# Patient Record
Sex: Female | Born: 1970 | Race: White | Hispanic: Yes | Marital: Married | Smoking: Former smoker
Health system: Southern US, Community
[De-identification: ages and names within clinical notes are randomized; demographics above are authoritative.]

## PROBLEM LIST (undated history)

## (undated) DIAGNOSIS — M797 Fibromyalgia: Secondary | ICD-10-CM

## (undated) DIAGNOSIS — O021 Missed abortion: Secondary | ICD-10-CM

## (undated) DIAGNOSIS — I10 Essential (primary) hypertension: Secondary | ICD-10-CM

## (undated) DIAGNOSIS — E288 Other ovarian dysfunction: Secondary | ICD-10-CM

## (undated) DIAGNOSIS — E119 Type 2 diabetes mellitus without complications: Secondary | ICD-10-CM

## (undated) DIAGNOSIS — K589 Irritable bowel syndrome without diarrhea: Secondary | ICD-10-CM

## (undated) DIAGNOSIS — E559 Vitamin D deficiency, unspecified: Secondary | ICD-10-CM

## (undated) DIAGNOSIS — E7800 Pure hypercholesterolemia, unspecified: Secondary | ICD-10-CM

## (undated) HISTORY — DX: Fibromyalgia: M79.7

## (undated) HISTORY — DX: Essential (primary) hypertension: I10

## (undated) HISTORY — DX: Irritable bowel syndrome, unspecified: K58.9

## (undated) HISTORY — DX: Pure hypercholesterolemia, unspecified: E78.00

## (undated) HISTORY — DX: Vitamin D deficiency, unspecified: E55.9

## (undated) HISTORY — DX: Other ovarian dysfunction: E28.8

## (undated) HISTORY — DX: Missed abortion: O02.1

---

## 1997-12-10 ENCOUNTER — Ambulatory Visit (HOSPITAL_COMMUNITY): Admission: RE | Admit: 1997-12-10 | Discharge: 1997-12-10 | Payer: Self-pay | Admitting: Gynecology

## 1998-01-26 ENCOUNTER — Encounter: Admission: RE | Admit: 1998-01-26 | Discharge: 1998-04-26 | Payer: Self-pay | Admitting: Gynecology

## 1998-03-04 ENCOUNTER — Inpatient Hospital Stay (HOSPITAL_COMMUNITY): Admission: AD | Admit: 1998-03-04 | Discharge: 1998-03-04 | Payer: Self-pay | Admitting: Gynecology

## 1998-03-05 ENCOUNTER — Inpatient Hospital Stay (HOSPITAL_COMMUNITY): Admission: AD | Admit: 1998-03-05 | Discharge: 1998-03-05 | Payer: Self-pay | Admitting: Gynecology

## 1998-05-20 ENCOUNTER — Encounter: Admission: RE | Admit: 1998-05-20 | Discharge: 1998-08-18 | Payer: Self-pay | Admitting: Gynecology

## 1998-05-28 ENCOUNTER — Inpatient Hospital Stay (HOSPITAL_COMMUNITY): Admission: AD | Admit: 1998-05-28 | Discharge: 1998-05-28 | Payer: Self-pay | Admitting: *Deleted

## 1998-09-06 ENCOUNTER — Inpatient Hospital Stay (HOSPITAL_COMMUNITY): Admission: AD | Admit: 1998-09-06 | Discharge: 1998-09-07 | Payer: Self-pay | Admitting: Gynecology

## 1998-10-21 ENCOUNTER — Other Ambulatory Visit: Admission: RE | Admit: 1998-10-21 | Discharge: 1998-10-21 | Payer: Self-pay | Admitting: Gynecology

## 1999-12-13 ENCOUNTER — Other Ambulatory Visit: Admission: RE | Admit: 1999-12-13 | Discharge: 1999-12-13 | Payer: Self-pay | Admitting: Gynecology

## 2000-07-25 ENCOUNTER — Encounter: Admission: RE | Admit: 2000-07-25 | Discharge: 2000-10-23 | Payer: Self-pay | Admitting: Specialist

## 2000-10-08 ENCOUNTER — Emergency Department (HOSPITAL_COMMUNITY): Admission: EM | Admit: 2000-10-08 | Discharge: 2000-10-08 | Payer: Self-pay | Admitting: Emergency Medicine

## 2000-10-24 ENCOUNTER — Encounter: Admission: RE | Admit: 2000-10-24 | Discharge: 2000-10-24 | Payer: Self-pay

## 2000-12-05 ENCOUNTER — Encounter: Admission: RE | Admit: 2000-12-05 | Discharge: 2000-12-05 | Payer: Self-pay | Admitting: Internal Medicine

## 2001-04-03 ENCOUNTER — Other Ambulatory Visit: Admission: RE | Admit: 2001-04-03 | Discharge: 2001-04-03 | Payer: Self-pay | Admitting: Gynecology

## 2001-08-20 ENCOUNTER — Encounter: Admission: RE | Admit: 2001-08-20 | Discharge: 2001-11-18 | Payer: Self-pay | Admitting: Gynecology

## 2001-10-07 ENCOUNTER — Inpatient Hospital Stay (HOSPITAL_COMMUNITY): Admission: AD | Admit: 2001-10-07 | Discharge: 2001-10-07 | Payer: Self-pay | Admitting: Gynecology

## 2001-11-12 ENCOUNTER — Encounter: Admission: RE | Admit: 2001-11-12 | Discharge: 2002-02-10 | Payer: Self-pay | Admitting: Gynecology

## 2002-01-05 ENCOUNTER — Encounter (HOSPITAL_COMMUNITY): Admission: RE | Admit: 2002-01-05 | Discharge: 2002-02-04 | Payer: Self-pay | Admitting: Gynecology

## 2002-02-03 ENCOUNTER — Encounter: Payer: Self-pay | Admitting: Gynecology

## 2002-02-06 ENCOUNTER — Inpatient Hospital Stay (HOSPITAL_COMMUNITY): Admission: AD | Admit: 2002-02-06 | Discharge: 2002-02-09 | Payer: Self-pay | Admitting: Gynecology

## 2002-02-06 ENCOUNTER — Encounter (INDEPENDENT_AMBULATORY_CARE_PROVIDER_SITE_OTHER): Payer: Self-pay

## 2002-03-26 ENCOUNTER — Other Ambulatory Visit: Admission: RE | Admit: 2002-03-26 | Discharge: 2002-03-26 | Payer: Self-pay | Admitting: Gynecology

## 2002-10-15 HISTORY — PX: CHOLECYSTECTOMY: SHX55

## 2003-01-02 ENCOUNTER — Encounter: Payer: Self-pay | Admitting: Internal Medicine

## 2003-01-02 ENCOUNTER — Ambulatory Visit (HOSPITAL_COMMUNITY): Admission: RE | Admit: 2003-01-02 | Discharge: 2003-01-02 | Payer: Self-pay | Admitting: Internal Medicine

## 2003-01-03 ENCOUNTER — Emergency Department (HOSPITAL_COMMUNITY): Admission: EM | Admit: 2003-01-03 | Discharge: 2003-01-03 | Payer: Self-pay | Admitting: Emergency Medicine

## 2003-01-03 ENCOUNTER — Encounter: Payer: Self-pay | Admitting: Emergency Medicine

## 2003-01-09 ENCOUNTER — Emergency Department (HOSPITAL_COMMUNITY): Admission: EM | Admit: 2003-01-09 | Discharge: 2003-01-09 | Payer: Self-pay | Admitting: Emergency Medicine

## 2003-01-11 ENCOUNTER — Encounter: Payer: Self-pay | Admitting: General Surgery

## 2003-01-11 ENCOUNTER — Encounter (INDEPENDENT_AMBULATORY_CARE_PROVIDER_SITE_OTHER): Payer: Self-pay | Admitting: *Deleted

## 2003-01-11 ENCOUNTER — Inpatient Hospital Stay (HOSPITAL_COMMUNITY): Admission: RE | Admit: 2003-01-11 | Discharge: 2003-01-13 | Payer: Self-pay | Admitting: General Surgery

## 2003-01-12 ENCOUNTER — Encounter: Payer: Self-pay | Admitting: General Surgery

## 2003-01-21 ENCOUNTER — Emergency Department (HOSPITAL_COMMUNITY): Admission: EM | Admit: 2003-01-21 | Discharge: 2003-01-21 | Payer: Self-pay | Admitting: Emergency Medicine

## 2003-01-21 ENCOUNTER — Encounter: Payer: Self-pay | Admitting: Emergency Medicine

## 2003-03-31 ENCOUNTER — Ambulatory Visit (HOSPITAL_COMMUNITY): Admission: RE | Admit: 2003-03-31 | Discharge: 2003-03-31 | Payer: Self-pay | Admitting: Family Medicine

## 2003-03-31 ENCOUNTER — Encounter: Payer: Self-pay | Admitting: Family Medicine

## 2003-03-31 ENCOUNTER — Encounter: Payer: Self-pay | Admitting: Emergency Medicine

## 2003-03-31 ENCOUNTER — Emergency Department (HOSPITAL_COMMUNITY): Admission: EM | Admit: 2003-03-31 | Discharge: 2003-03-31 | Payer: Self-pay | Admitting: Emergency Medicine

## 2003-04-27 ENCOUNTER — Other Ambulatory Visit: Admission: RE | Admit: 2003-04-27 | Discharge: 2003-04-27 | Payer: Self-pay | Admitting: Gynecology

## 2003-06-07 ENCOUNTER — Emergency Department (HOSPITAL_COMMUNITY): Admission: EM | Admit: 2003-06-07 | Discharge: 2003-06-07 | Payer: Self-pay

## 2004-05-25 ENCOUNTER — Emergency Department (HOSPITAL_COMMUNITY): Admission: EM | Admit: 2004-05-25 | Discharge: 2004-05-26 | Payer: Self-pay

## 2004-05-28 ENCOUNTER — Observation Stay (HOSPITAL_COMMUNITY): Admission: AD | Admit: 2004-05-28 | Discharge: 2004-05-29 | Payer: Self-pay | Admitting: Gynecology

## 2004-05-28 ENCOUNTER — Encounter (INDEPENDENT_AMBULATORY_CARE_PROVIDER_SITE_OTHER): Payer: Self-pay | Admitting: Specialist

## 2004-06-15 ENCOUNTER — Other Ambulatory Visit: Admission: RE | Admit: 2004-06-15 | Discharge: 2004-06-15 | Payer: Self-pay | Admitting: Obstetrics and Gynecology

## 2004-07-15 ENCOUNTER — Emergency Department (HOSPITAL_COMMUNITY): Admission: EM | Admit: 2004-07-15 | Discharge: 2004-07-15 | Payer: Self-pay | Admitting: Unknown Physician Specialty

## 2004-07-15 ENCOUNTER — Emergency Department (HOSPITAL_COMMUNITY): Admission: EM | Admit: 2004-07-15 | Discharge: 2004-07-15 | Payer: Self-pay | Admitting: Emergency Medicine

## 2004-08-28 ENCOUNTER — Ambulatory Visit: Payer: Self-pay | Admitting: Nurse Practitioner

## 2004-08-29 ENCOUNTER — Ambulatory Visit: Payer: Self-pay | Admitting: Nurse Practitioner

## 2004-08-29 ENCOUNTER — Ambulatory Visit: Payer: Self-pay | Admitting: *Deleted

## 2004-08-31 ENCOUNTER — Ambulatory Visit (HOSPITAL_COMMUNITY): Admission: RE | Admit: 2004-08-31 | Discharge: 2004-08-31 | Payer: Self-pay | Admitting: Internal Medicine

## 2004-09-21 ENCOUNTER — Ambulatory Visit: Payer: Self-pay | Admitting: Nurse Practitioner

## 2004-11-08 ENCOUNTER — Ambulatory Visit: Payer: Self-pay | Admitting: Nurse Practitioner

## 2004-11-15 ENCOUNTER — Emergency Department (HOSPITAL_COMMUNITY): Admission: EM | Admit: 2004-11-15 | Discharge: 2004-11-16 | Payer: Self-pay | Admitting: Emergency Medicine

## 2004-12-25 ENCOUNTER — Ambulatory Visit: Payer: Self-pay | Admitting: Nurse Practitioner

## 2004-12-27 ENCOUNTER — Ambulatory Visit (HOSPITAL_COMMUNITY): Admission: RE | Admit: 2004-12-27 | Discharge: 2004-12-27 | Payer: Self-pay | Admitting: Gastroenterology

## 2004-12-27 ENCOUNTER — Encounter (INDEPENDENT_AMBULATORY_CARE_PROVIDER_SITE_OTHER): Payer: Self-pay | Admitting: *Deleted

## 2005-01-11 ENCOUNTER — Ambulatory Visit: Payer: Self-pay | Admitting: Nurse Practitioner

## 2005-02-19 ENCOUNTER — Ambulatory Visit: Payer: Self-pay | Admitting: Nurse Practitioner

## 2005-03-13 ENCOUNTER — Ambulatory Visit: Payer: Self-pay | Admitting: Nurse Practitioner

## 2005-04-12 ENCOUNTER — Ambulatory Visit: Payer: Self-pay | Admitting: Nurse Practitioner

## 2005-07-03 ENCOUNTER — Ambulatory Visit: Payer: Self-pay | Admitting: Nurse Practitioner

## 2005-07-03 ENCOUNTER — Emergency Department (HOSPITAL_COMMUNITY): Admission: EM | Admit: 2005-07-03 | Discharge: 2005-07-03 | Payer: Self-pay | Admitting: Emergency Medicine

## 2005-09-04 ENCOUNTER — Other Ambulatory Visit: Admission: RE | Admit: 2005-09-04 | Discharge: 2005-09-04 | Payer: Self-pay | Admitting: Gynecology

## 2005-09-25 ENCOUNTER — Ambulatory Visit: Payer: Self-pay | Admitting: Nurse Practitioner

## 2005-10-10 ENCOUNTER — Ambulatory Visit: Payer: Self-pay | Admitting: Nurse Practitioner

## 2005-11-05 ENCOUNTER — Ambulatory Visit: Payer: Self-pay | Admitting: Nurse Practitioner

## 2005-11-13 ENCOUNTER — Ambulatory Visit (HOSPITAL_COMMUNITY): Admission: RE | Admit: 2005-11-13 | Discharge: 2005-11-13 | Payer: Self-pay | Admitting: Internal Medicine

## 2005-11-13 ENCOUNTER — Ambulatory Visit: Payer: Self-pay | Admitting: Nurse Practitioner

## 2005-11-29 ENCOUNTER — Emergency Department (HOSPITAL_COMMUNITY): Admission: EM | Admit: 2005-11-29 | Discharge: 2005-11-29 | Payer: Self-pay | Admitting: Emergency Medicine

## 2006-04-09 ENCOUNTER — Ambulatory Visit: Payer: Self-pay | Admitting: Internal Medicine

## 2006-04-16 ENCOUNTER — Ambulatory Visit: Payer: Self-pay | Admitting: Internal Medicine

## 2006-04-18 ENCOUNTER — Ambulatory Visit: Payer: Self-pay | Admitting: Nurse Practitioner

## 2006-06-12 ENCOUNTER — Ambulatory Visit: Payer: Self-pay | Admitting: Nurse Practitioner

## 2006-07-05 ENCOUNTER — Ambulatory Visit: Payer: Self-pay | Admitting: Family Medicine

## 2006-08-06 ENCOUNTER — Ambulatory Visit: Payer: Self-pay | Admitting: Nurse Practitioner

## 2006-08-07 ENCOUNTER — Ambulatory Visit (HOSPITAL_COMMUNITY): Admission: RE | Admit: 2006-08-07 | Discharge: 2006-08-07 | Payer: Self-pay | Admitting: Internal Medicine

## 2006-08-09 ENCOUNTER — Inpatient Hospital Stay (HOSPITAL_COMMUNITY): Admission: AD | Admit: 2006-08-09 | Discharge: 2006-08-09 | Payer: Self-pay | Admitting: Gynecology

## 2006-09-26 ENCOUNTER — Ambulatory Visit: Payer: Self-pay | Admitting: Nurse Practitioner

## 2006-12-19 ENCOUNTER — Ambulatory Visit: Payer: Self-pay | Admitting: Nurse Practitioner

## 2007-02-01 ENCOUNTER — Emergency Department (HOSPITAL_COMMUNITY): Admission: EM | Admit: 2007-02-01 | Discharge: 2007-02-01 | Payer: Self-pay | Admitting: Emergency Medicine

## 2007-02-05 ENCOUNTER — Ambulatory Visit: Payer: Self-pay | Admitting: Nurse Practitioner

## 2007-02-12 ENCOUNTER — Ambulatory Visit: Payer: Self-pay | Admitting: Nurse Practitioner

## 2007-04-01 ENCOUNTER — Ambulatory Visit: Payer: Self-pay | Admitting: Internal Medicine

## 2007-04-08 ENCOUNTER — Ambulatory Visit: Payer: Self-pay | Admitting: Internal Medicine

## 2007-04-12 ENCOUNTER — Emergency Department (HOSPITAL_COMMUNITY): Admission: EM | Admit: 2007-04-12 | Discharge: 2007-04-12 | Payer: Self-pay | Admitting: Emergency Medicine

## 2007-04-14 DIAGNOSIS — J309 Allergic rhinitis, unspecified: Secondary | ICD-10-CM | POA: Insufficient documentation

## 2007-04-14 DIAGNOSIS — K219 Gastro-esophageal reflux disease without esophagitis: Secondary | ICD-10-CM

## 2007-04-14 DIAGNOSIS — I10 Essential (primary) hypertension: Secondary | ICD-10-CM | POA: Insufficient documentation

## 2007-04-14 DIAGNOSIS — E1169 Type 2 diabetes mellitus with other specified complication: Secondary | ICD-10-CM | POA: Insufficient documentation

## 2007-04-14 DIAGNOSIS — E119 Type 2 diabetes mellitus without complications: Secondary | ICD-10-CM

## 2007-04-14 DIAGNOSIS — M8580 Other specified disorders of bone density and structure, unspecified site: Secondary | ICD-10-CM

## 2007-04-14 DIAGNOSIS — F411 Generalized anxiety disorder: Secondary | ICD-10-CM

## 2007-04-14 DIAGNOSIS — R519 Headache, unspecified: Secondary | ICD-10-CM | POA: Insufficient documentation

## 2007-04-14 DIAGNOSIS — R51 Headache: Secondary | ICD-10-CM

## 2007-04-14 DIAGNOSIS — Z87442 Personal history of urinary calculi: Secondary | ICD-10-CM

## 2007-04-16 ENCOUNTER — Ambulatory Visit: Payer: Self-pay | Admitting: Internal Medicine

## 2007-07-02 ENCOUNTER — Encounter (INDEPENDENT_AMBULATORY_CARE_PROVIDER_SITE_OTHER): Payer: Self-pay | Admitting: *Deleted

## 2007-09-15 ENCOUNTER — Ambulatory Visit: Payer: Self-pay | Admitting: Family Medicine

## 2007-11-28 ENCOUNTER — Ambulatory Visit: Payer: Self-pay | Admitting: Family Medicine

## 2008-01-20 ENCOUNTER — Ambulatory Visit: Payer: Self-pay | Admitting: Internal Medicine

## 2008-02-26 ENCOUNTER — Ambulatory Visit: Payer: Self-pay | Admitting: Internal Medicine

## 2008-02-26 LAB — CONVERTED CEMR LAB
AST: 14 units/L (ref 0–37)
Alkaline Phosphatase: 68 units/L (ref 39–117)
BUN: 10 mg/dL (ref 6–23)
Basophils Relative: 1 % (ref 0–1)
Calcium: 8.9 mg/dL (ref 8.4–10.5)
Creatinine, Ser: 0.59 mg/dL (ref 0.40–1.20)
Eosinophils Relative: 2 % (ref 0–5)
Glucose, Bld: 106 mg/dL — ABNORMAL HIGH (ref 70–99)
HCT: 39.3 % (ref 36.0–46.0)
MCHC: 36.4 g/dL — ABNORMAL HIGH (ref 30.0–36.0)
MCV: 91.8 fL (ref 78.0–100.0)
Monocytes Absolute: 0.8 10*3/uL (ref 0.1–1.0)
Monocytes Relative: 8 % (ref 3–12)
Neutro Abs: 5.8 10*3/uL (ref 1.7–7.7)
Neutrophils Relative %: 59 % (ref 43–77)
Platelets: 351 10*3/uL (ref 150–400)
Potassium: 4.3 meq/L (ref 3.5–5.3)
RDW: 13.9 % (ref 11.5–15.5)

## 2008-03-05 ENCOUNTER — Ambulatory Visit (HOSPITAL_COMMUNITY): Admission: RE | Admit: 2008-03-05 | Discharge: 2008-03-05 | Payer: Self-pay | Admitting: Family Medicine

## 2008-04-01 ENCOUNTER — Ambulatory Visit: Payer: Self-pay | Admitting: Internal Medicine

## 2008-04-01 ENCOUNTER — Ambulatory Visit (HOSPITAL_COMMUNITY): Admission: RE | Admit: 2008-04-01 | Discharge: 2008-04-01 | Payer: Self-pay | Admitting: Internal Medicine

## 2008-04-02 ENCOUNTER — Encounter: Payer: Self-pay | Admitting: Internal Medicine

## 2008-04-14 ENCOUNTER — Ambulatory Visit: Payer: Self-pay | Admitting: Internal Medicine

## 2008-04-14 DIAGNOSIS — K589 Irritable bowel syndrome without diarrhea: Secondary | ICD-10-CM | POA: Insufficient documentation

## 2008-06-22 ENCOUNTER — Ambulatory Visit: Payer: Self-pay | Admitting: Internal Medicine

## 2008-06-29 ENCOUNTER — Ambulatory Visit: Payer: Self-pay | Admitting: Internal Medicine

## 2008-07-14 ENCOUNTER — Ambulatory Visit: Payer: Self-pay | Admitting: Internal Medicine

## 2008-07-14 LAB — CONVERTED CEMR LAB
Alkaline Phosphatase: 60 units/L (ref 39–117)
CO2: 19 meq/L (ref 19–32)
Calcium: 8.7 mg/dL (ref 8.4–10.5)
Chloride: 108 meq/L (ref 96–112)
Cholesterol: 160 mg/dL (ref 0–200)
LDL Cholesterol: 84 mg/dL (ref 0–99)
Potassium: 4 meq/L (ref 3.5–5.3)
Total Bilirubin: 0.4 mg/dL (ref 0.3–1.2)
Total CHOL/HDL Ratio: 3.5
Triglycerides: 152 mg/dL — ABNORMAL HIGH (ref <150)
VLDL: 30 mg/dL (ref 0–40)

## 2008-07-19 ENCOUNTER — Ambulatory Visit: Payer: Self-pay | Admitting: Internal Medicine

## 2008-08-05 ENCOUNTER — Emergency Department (HOSPITAL_COMMUNITY): Admission: EM | Admit: 2008-08-05 | Discharge: 2008-08-05 | Payer: Self-pay | Admitting: Emergency Medicine

## 2008-09-04 ENCOUNTER — Emergency Department (HOSPITAL_COMMUNITY): Admission: EM | Admit: 2008-09-04 | Discharge: 2008-09-04 | Payer: Self-pay | Admitting: Emergency Medicine

## 2008-09-27 ENCOUNTER — Emergency Department (HOSPITAL_COMMUNITY): Admission: EM | Admit: 2008-09-27 | Discharge: 2008-09-27 | Payer: Self-pay | Admitting: Emergency Medicine

## 2008-09-27 ENCOUNTER — Encounter (INDEPENDENT_AMBULATORY_CARE_PROVIDER_SITE_OTHER): Payer: Self-pay | Admitting: *Deleted

## 2008-09-28 ENCOUNTER — Emergency Department (HOSPITAL_COMMUNITY): Admission: EM | Admit: 2008-09-28 | Discharge: 2008-09-28 | Payer: Self-pay | Admitting: Emergency Medicine

## 2008-09-30 ENCOUNTER — Telehealth: Payer: Self-pay | Admitting: Internal Medicine

## 2008-10-13 ENCOUNTER — Ambulatory Visit: Payer: Self-pay | Admitting: Family Medicine

## 2008-10-15 HISTORY — PX: COLONOSCOPY: SHX174

## 2008-10-22 ENCOUNTER — Encounter (INDEPENDENT_AMBULATORY_CARE_PROVIDER_SITE_OTHER): Payer: Self-pay | Admitting: *Deleted

## 2008-10-22 ENCOUNTER — Ambulatory Visit: Payer: Self-pay | Admitting: Internal Medicine

## 2008-10-26 ENCOUNTER — Ambulatory Visit: Payer: Self-pay | Admitting: Internal Medicine

## 2008-10-27 LAB — CONVERTED CEMR LAB: Fecal Occult Bld: POSITIVE — AB

## 2008-11-02 ENCOUNTER — Ambulatory Visit: Payer: Self-pay | Admitting: Internal Medicine

## 2008-11-15 ENCOUNTER — Ambulatory Visit: Payer: Self-pay | Admitting: Internal Medicine

## 2008-12-18 ENCOUNTER — Emergency Department (HOSPITAL_COMMUNITY): Admission: EM | Admit: 2008-12-18 | Discharge: 2008-12-18 | Payer: Self-pay | Admitting: Emergency Medicine

## 2009-01-31 ENCOUNTER — Encounter: Payer: Self-pay | Admitting: Internal Medicine

## 2009-01-31 ENCOUNTER — Ambulatory Visit: Payer: Self-pay | Admitting: Internal Medicine

## 2009-01-31 DIAGNOSIS — N390 Urinary tract infection, site not specified: Secondary | ICD-10-CM

## 2009-02-28 ENCOUNTER — Ambulatory Visit: Payer: Self-pay | Admitting: Internal Medicine

## 2009-03-08 ENCOUNTER — Emergency Department (HOSPITAL_COMMUNITY): Admission: EM | Admit: 2009-03-08 | Discharge: 2009-03-09 | Payer: Self-pay | Admitting: Emergency Medicine

## 2009-06-01 ENCOUNTER — Ambulatory Visit: Payer: Self-pay | Admitting: Internal Medicine

## 2009-06-01 ENCOUNTER — Encounter (INDEPENDENT_AMBULATORY_CARE_PROVIDER_SITE_OTHER): Payer: Self-pay | Admitting: Internal Medicine

## 2009-07-12 ENCOUNTER — Ambulatory Visit: Payer: Self-pay | Admitting: Internal Medicine

## 2009-07-12 ENCOUNTER — Encounter (INDEPENDENT_AMBULATORY_CARE_PROVIDER_SITE_OTHER): Payer: Self-pay | Admitting: Internal Medicine

## 2009-07-12 LAB — CONVERTED CEMR LAB
ALT: 74 units/L — ABNORMAL HIGH (ref 0–35)
AST: 46 units/L — ABNORMAL HIGH (ref 0–37)
Alkaline Phosphatase: 95 units/L (ref 39–117)
BUN: 9 mg/dL (ref 6–23)
Creatinine, Ser: 0.59 mg/dL (ref 0.40–1.20)
Eosinophils Absolute: 0.7 10*3/uL (ref 0.0–0.7)
Glucose, Bld: 90 mg/dL (ref 70–99)
MCV: 93.5 fL (ref 78.0–100.0)
Monocytes Absolute: 0.8 10*3/uL (ref 0.1–1.0)
Monocytes Relative: 7 % (ref 3–12)
Neutro Abs: 5.6 10*3/uL (ref 1.7–7.7)
Neutrophils Relative %: 53 % (ref 43–77)
Preg, Serum: NEGATIVE
RBC: 4.47 M/uL (ref 3.87–5.11)
TSH: 3.236 microintl units/mL (ref 0.350–4.500)
Total Protein: 6.8 g/dL (ref 6.0–8.3)
WBC: 10.5 10*3/uL (ref 4.0–10.5)

## 2009-07-13 ENCOUNTER — Emergency Department (HOSPITAL_COMMUNITY): Admission: EM | Admit: 2009-07-13 | Discharge: 2009-07-13 | Payer: Self-pay | Admitting: Emergency Medicine

## 2009-07-13 ENCOUNTER — Encounter (INDEPENDENT_AMBULATORY_CARE_PROVIDER_SITE_OTHER): Payer: Self-pay | Admitting: Internal Medicine

## 2009-07-14 ENCOUNTER — Encounter (INDEPENDENT_AMBULATORY_CARE_PROVIDER_SITE_OTHER): Payer: Self-pay | Admitting: Internal Medicine

## 2009-07-14 LAB — CONVERTED CEMR LAB

## 2009-08-02 ENCOUNTER — Encounter (INDEPENDENT_AMBULATORY_CARE_PROVIDER_SITE_OTHER): Payer: Self-pay | Admitting: Internal Medicine

## 2009-08-02 ENCOUNTER — Ambulatory Visit: Payer: Self-pay | Admitting: Internal Medicine

## 2009-08-02 LAB — CONVERTED CEMR LAB
Bilirubin, Direct: 0.1 mg/dL (ref 0.0–0.3)
Total Bilirubin: 0.4 mg/dL (ref 0.3–1.2)

## 2009-08-13 ENCOUNTER — Emergency Department (HOSPITAL_COMMUNITY): Admission: EM | Admit: 2009-08-13 | Discharge: 2009-08-14 | Payer: Self-pay | Admitting: Emergency Medicine

## 2009-08-22 ENCOUNTER — Encounter: Payer: Self-pay | Admitting: Nurse Practitioner

## 2009-08-22 ENCOUNTER — Telehealth: Payer: Self-pay | Admitting: Internal Medicine

## 2009-08-22 ENCOUNTER — Ambulatory Visit: Payer: Self-pay | Admitting: Gastroenterology

## 2009-08-22 DIAGNOSIS — R1030 Lower abdominal pain, unspecified: Secondary | ICD-10-CM | POA: Insufficient documentation

## 2009-08-22 DIAGNOSIS — R1031 Right lower quadrant pain: Secondary | ICD-10-CM | POA: Insufficient documentation

## 2009-08-22 DIAGNOSIS — M62838 Other muscle spasm: Secondary | ICD-10-CM | POA: Insufficient documentation

## 2009-08-22 DIAGNOSIS — K649 Unspecified hemorrhoids: Secondary | ICD-10-CM

## 2009-10-06 ENCOUNTER — Ambulatory Visit: Payer: Self-pay | Admitting: Internal Medicine

## 2009-10-06 ENCOUNTER — Encounter (INDEPENDENT_AMBULATORY_CARE_PROVIDER_SITE_OTHER): Payer: Self-pay | Admitting: Internal Medicine

## 2009-10-06 LAB — CONVERTED CEMR LAB
ALT: 24 units/L (ref 0–35)
AST: 20 units/L (ref 0–37)
Albumin: 3.8 g/dL (ref 3.5–5.2)
Alkaline Phosphatase: 74 units/L (ref 39–117)
Total Bilirubin: 0.3 mg/dL (ref 0.3–1.2)
Total Protein: 6.9 g/dL (ref 6.0–8.3)

## 2009-11-25 ENCOUNTER — Ambulatory Visit: Payer: Self-pay | Admitting: Family Medicine

## 2009-12-09 ENCOUNTER — Emergency Department (HOSPITAL_COMMUNITY): Admission: EM | Admit: 2009-12-09 | Discharge: 2009-12-09 | Payer: Self-pay | Admitting: Emergency Medicine

## 2009-12-09 ENCOUNTER — Ambulatory Visit: Payer: Self-pay | Admitting: Internal Medicine

## 2009-12-27 ENCOUNTER — Ambulatory Visit: Payer: Self-pay | Admitting: Internal Medicine

## 2009-12-27 LAB — CONVERTED CEMR LAB
Albumin: 3.8 g/dL (ref 3.5–5.2)
Alkaline Phosphatase: 85 units/L (ref 39–117)
BUN: 12 mg/dL (ref 6–23)
Cholesterol: 158 mg/dL (ref 0–200)
HDL: 41 mg/dL (ref >39)
Indirect Bilirubin: 0.3 mg/dL (ref 0.0–0.9)
Sodium: 138 meq/L (ref 135–145)
Total Protein: 6.6 g/dL (ref 6.0–8.3)
Triglycerides: 151 mg/dL — ABNORMAL HIGH (ref <150)
VLDL: 30 mg/dL (ref 0–40)

## 2010-06-14 ENCOUNTER — Emergency Department (HOSPITAL_COMMUNITY): Admission: EM | Admit: 2010-06-14 | Discharge: 2010-06-14 | Payer: Self-pay | Admitting: Emergency Medicine

## 2010-08-08 ENCOUNTER — Encounter (INDEPENDENT_AMBULATORY_CARE_PROVIDER_SITE_OTHER): Payer: Self-pay | Admitting: *Deleted

## 2010-08-08 LAB — CONVERTED CEMR LAB
Basophils Absolute: 0.2 10*3/uL — ABNORMAL HIGH (ref 0.0–0.1)
Basophils Relative: 2 % — ABNORMAL HIGH (ref 0–1)
CRP: 1.4 mg/dL — ABNORMAL HIGH (ref <0.6)
Eosinophils Relative: 3 % (ref 0–5)
Hemoglobin: 14.7 g/dL (ref 12.0–15.0)
Lymphocytes Relative: 43 % (ref 12–46)
MCHC: 32.7 g/dL (ref 30.0–36.0)
Monocytes Absolute: 0.9 10*3/uL (ref 0.1–1.0)
Neutro Abs: 2.9 10*3/uL (ref 1.7–7.7)
Platelets: 307 10*3/uL (ref 150–400)
RDW: 13 % (ref 11.5–15.5)
Sed Rate: 9 mm/hr (ref 0–22)

## 2010-08-10 ENCOUNTER — Ambulatory Visit (HOSPITAL_COMMUNITY)
Admission: RE | Admit: 2010-08-10 | Discharge: 2010-08-10 | Payer: Self-pay | Source: Home / Self Care | Admitting: Family Medicine

## 2010-08-10 ENCOUNTER — Encounter (INDEPENDENT_AMBULATORY_CARE_PROVIDER_SITE_OTHER): Payer: Self-pay | Admitting: *Deleted

## 2010-11-05 ENCOUNTER — Encounter: Payer: Self-pay | Admitting: Internal Medicine

## 2010-12-28 LAB — POCT PREGNANCY, URINE: Preg Test, Ur: NEGATIVE

## 2011-01-03 LAB — GLUCOSE, CAPILLARY
Glucose-Capillary: 179 mg/dL — ABNORMAL HIGH (ref 70–99)
Glucose-Capillary: 98 mg/dL (ref 70–99)

## 2011-01-18 LAB — DIFFERENTIAL
Lymphocytes Relative: 30 % (ref 12–46)
Lymphs Abs: 3.3 10*3/uL (ref 0.7–4.0)
Monocytes Absolute: 0.9 10*3/uL (ref 0.1–1.0)
Monocytes Relative: 8 % (ref 3–12)
Neutro Abs: 6.2 10*3/uL (ref 1.7–7.7)

## 2011-01-18 LAB — URINALYSIS, ROUTINE W REFLEX MICROSCOPIC
Glucose, UA: NEGATIVE mg/dL
Protein, ur: NEGATIVE mg/dL
pH: 6 (ref 5.0–8.0)

## 2011-01-18 LAB — GC/CHLAMYDIA PROBE AMP, GENITAL
Chlamydia, DNA Probe: NEGATIVE
GC Probe Amp, Genital: NEGATIVE

## 2011-01-18 LAB — WET PREP, GENITAL: Clue Cells Wet Prep HPF POC: NONE SEEN

## 2011-01-18 LAB — HEPATIC FUNCTION PANEL
AST: 34 U/L (ref 0–37)
Albumin: 3.7 g/dL (ref 3.5–5.2)
Total Protein: 7.2 g/dL (ref 6.0–8.3)

## 2011-01-18 LAB — URINE CULTURE: Colony Count: 80000

## 2011-01-18 LAB — BASIC METABOLIC PANEL
CO2: 25 mEq/L (ref 19–32)
Calcium: 9.1 mg/dL (ref 8.4–10.5)
GFR calc Af Amer: >60 mL/min (ref >60)
GFR calc non Af Amer: >60 mL/min (ref >60)
Sodium: 137 mEq/L (ref 135–145)

## 2011-01-18 LAB — URINE MICROSCOPIC-ADD ON

## 2011-01-18 LAB — POCT PREGNANCY, URINE: Preg Test, Ur: NEGATIVE

## 2011-01-18 LAB — CBC
Hemoglobin: 11.7 g/dL — ABNORMAL LOW (ref 12.0–15.0)
MCHC: 34.4 g/dL (ref 30.0–36.0)
RBC: 3.74 MIL/uL — ABNORMAL LOW (ref 3.87–5.11)

## 2011-01-19 LAB — URINALYSIS, ROUTINE W REFLEX MICROSCOPIC
Bilirubin Urine: NEGATIVE
Glucose, UA: NEGATIVE mg/dL
Hgb urine dipstick: NEGATIVE
Specific Gravity, Urine: 1.019 (ref 1.005–1.030)
Urobilinogen, UA: 1 mg/dL (ref 0.0–1.0)

## 2011-01-19 LAB — CBC
Platelets: 263 10*3/uL (ref 150–400)
RDW: 12.7 % (ref 11.5–15.5)
WBC: 9.9 10*3/uL (ref 4.0–10.5)

## 2011-01-19 LAB — DIFFERENTIAL
Basophils Absolute: 0.1 10*3/uL (ref 0.0–0.1)
Eosinophils Relative: 3 % (ref 0–5)
Lymphocytes Relative: 27 % (ref 12–46)
Monocytes Absolute: 0.8 10*3/uL (ref 0.1–1.0)
Monocytes Relative: 8 % (ref 3–12)

## 2011-01-19 LAB — COMPREHENSIVE METABOLIC PANEL
AST: 55 U/L — ABNORMAL HIGH (ref 0–37)
Albumin: 4 g/dL (ref 3.5–5.2)
Alkaline Phosphatase: 100 U/L (ref 39–117)
Chloride: 104 mEq/L (ref 96–112)
Creatinine, Ser: 0.68 mg/dL (ref 0.4–1.2)
GFR calc Af Amer: >60 mL/min (ref >60)
Potassium: 3.6 mEq/L (ref 3.5–5.1)
Total Bilirubin: 0.5 mg/dL (ref 0.3–1.2)
Total Protein: 7.4 g/dL (ref 6.0–8.3)

## 2011-01-19 LAB — URINE MICROSCOPIC-ADD ON

## 2011-01-23 LAB — POCT PREGNANCY, URINE: Preg Test, Ur: NEGATIVE

## 2011-01-25 LAB — URINALYSIS, ROUTINE W REFLEX MICROSCOPIC
Bilirubin Urine: NEGATIVE
Ketones, ur: NEGATIVE mg/dL
Nitrite: NEGATIVE
Protein, ur: NEGATIVE mg/dL
Urobilinogen, UA: 0.2 mg/dL (ref 0.0–1.0)

## 2011-01-25 LAB — URINE MICROSCOPIC-ADD ON

## 2011-03-02 NOTE — H&P (Signed)
[facility]  Patient:    Amy [name], Amy [name] Visit Number: [id number] MRN: [id number]          Service Type: ANT Location: [facility] Attending Physician:  Merrily Pew Dictated by:   Gaetano Hawthorne Lily Peer, M.D. Admit Date:  01/05/2002 Discharge Date: 02/04/2002                           History and Physical  PREOPERATIVE HISTORY AND PHYSICAL  CHIEF COMPLAINT: 1. Gestational diabetic, insolent. 2. Term intrauterine pregnancy at 49 weeks estimated gestational age. 3. Fetal macrosomia.  HISTORY:                      The patient is a 40 year old gravida 3, para 2, with an estimated date of confinement of Feb 24, 2002.  The patient had conceived this pregnancy on Clomid and ultrasounds had concurred with her dates and had a singleton pregnancy.  With her previous pregnancy she had gestational diabetes which required insulin as well as this pregnancy as well. Her last insulin regimen consisted of 10 units of Regular Insulin in the morning along with 11 units of Regular Insulin at lunch along with 3 units of NPH at lunch and 20 units of NPH in the evening before bedtime. The patient has been followed with serial nonstress tests and biophysical profiles and has done well.  The most recent ultrasound was done at the time of amniocentesis for fetal lung maturity which was done at [facility] on April 22; the ultrasound demonstrated an estimated fetal weight of 4202 g and at the 97th percentile for 37 weeks.  Amniotic fluid was normal.  The fetus was in the vertex presentation.  There was some question of some mild dolichocephaly noted.  The amniocentesis on that date of April 22, demonstrated an LS ratio of 3.9 to 1.0 with PG present and with these findings instead of the planned induction we decided to proceed with a primary cesarean section due to fetal macrosomia and to minimize potential complications during delivery.  All this was discussed with  the patient who felt comfortable and will proceed accordingly.  PAST MEDICAL HISTORY:         She denies any allergies.  In 1992 she had a normal spontaneous vaginal delivery of a 7 pound baby; in 1999 had a female infant delivered, 9 pounds 3 ounces, and resulted in postpartum hemorrhage. Past pregnancy she had positive GBS; she was cultured this pregnancy and it was negative GBS.  She denies any other operations and no other medical problem.  REVIEW OF SYSTEMS:            See Hollister form.  PHYSICAL EXAMINATION:  HEENT:                        Unremarkable.  NECK:                         Supple, trachea midline, no carotid bruits, no thyromegaly.  LUNGS:                        Clear to auscultation without rhonchi, rales or wheezes.  HEART:                        Regular rate and rhythm, no murmurs or gallops.  BREAST EXAM:  Not done.  ABDOMEN:                      Gravid uterus, fundal height approximately 42 cm, vertex presentation by Thayer Ohm maneuver, positive fetal heart tones.  PELVIC EXAM:                  Cervix long, closed and posterior.  EXTREMITIES:                  Deep tendon reflexes 1+, trace edema.  PRENATAL LABORATORIES:        A positive blood type.  Negative antibody screen.  VDRL was nonreactive.  Rubella immune.  Hepatitis B surface antigen and HIV were negative.  Pap smear was normal.  Alpha-fetoprotein was normal. Recent GBS culture was negative, prior pregnancy it was positive.  The patient with gestational diabetes, on insulin.  Recent urinary tract infection; the patient was on Macrobid and was switched to Keflex.  ASSESSMENT:                   A 40 year old gravida 3, para 2, at 82 weeks estimated gestational age, with gestational diabetes requiring insulin, recent ultrasound demonstrated fetal macrosomia with approximately 4200 g fetus in the 97th percentile for a 36-week gestation.  We had an extensive discussion that although  she did deliver a 9 pound baby in the past due to the fact that she has got diabetes with insolent there could be a 15% variation whereby the fetus may weigh more than the 4200 g and increase the likelihood for potential problems such as shoulder dystocia and obstetrical difficulty during delivery. All this was discussed with the patient and her and her husband decided and agreed to proceed with a primary cesarean section.  They are not interested in a tubal sterilization procedure.  [name] have asked her to the night before her surgery to hold off her NPH insulin and her insulin in the morning and the rest of the day before her planned noon surgery and to have one very light meal at approximately 6 a.m. on the day of surgery to allow 6-7 hours of gastric emptying before her planned C-section.  Her recent LS and PG demonstrated fetal lung maturity with an LS ratio of 3.9 and PG was present and this was discussed with the patient as well.  All questions were answered. The risks, benefits, pros and cons of the procedure to include infection, bleeding, trauma to internal organs were discussed with the patient; all questions were answered and will follow accordingly.  PLAN:                         The patient is scheduled for primary cesarean section today at 1300 hours at [facility]. Dictated by:   Gaetano Hawthorne. Lily Peer, M.D. Attending Physician:  Merrily Pew DD:  02/06/02 TD:  02/06/02 Job: [id number]

## 2011-03-02 NOTE — Op Note (Signed)
Amy [name], Amy [name]               ACCOUNT NO.:  [id number]   MEDICAL RECORD NO.:  [id number]          PATIENT TYPE:  AMB   LOCATION:  ENDO                         FACILITY:  [facility]   PHYSICIAN:  Graylin Shiver, M.D.   DATE OF BIRTH:  Mar 25, 1971   DATE OF PROCEDURE:  12/27/2004  DATE OF DISCHARGE:                                 OPERATIVE REPORT   PROCEDURE:  Esophagogastroduodenoscopy with biopsy for CLO-test.   ENDOSCOPIST:  Graylin Shiver, M.D.   INDICATIONS FOR PROCEDURE:  Epigastric pain, bloating.   INFORMED CONSENT:  Informed consent was obtained after explanation of the  risks of bleeding, infection and perforation.   PREMEDICATION:  Fentanyl 70 mcg IV, Versed 6 mg IV.   PROCEDURE:  With the patient in the left lateral decubitus position, the  Olympus gastroscope was inserted into the oropharynx and passed into the  esophagus.  It was advanced down the esophagus, then into the stomach and  into the duodenum.  The second portion and bulb of the duodenum were normal.  The stomach showed a diffuse moderate erythematous appearance to the mucosa  compatible with gastritis.  A biopsy for CLO-test was obtained.  No lesions  were seen in the fundus or cardia.  The esophagus looked normal in its  entirety.  The esophagogastric junction was at 39 cm.  She tolerated the  procedure well without complications.   IMPRESSION:  Mild gastritis.   PLAN:  The CLO test will be checked.      SFG/MEDQ  D:  12/27/2004  T:  12/27/2004  Job:  [id number]   cc:   Tresa Endo L. Philipp Deputy, M.D.  [phone]  [place] [address]  Fax: [phone]

## 2011-03-02 NOTE — Op Note (Signed)
[name], Amy [name]               ACCOUNT NO.:  [id number]   MEDICAL RECORD NO.:  [id number]          PATIENT TYPE:  AMB   LOCATION:  ENDO                         FACILITY:  [facility]   PHYSICIAN:  Graylin Shiver, M.D.   DATE OF BIRTH:  07-25-1971   DATE OF PROCEDURE:  12/27/2004  DATE OF DISCHARGE:                                 OPERATIVE REPORT   PROCEDURE:  Flexible sigmoidoscopy.   ENDOSCOPIST:  Graylin Shiver, M.D.   INDICATIONS FOR PROCEDURE:  The patient complains of abdominal bloating,  also irritation in the anal area.   INFORMED CONSENT:  Informed consent was obtained after explanation of the  risks of bleeding, infection and perforation.   SEDATION:  The procedure was done immediately after an EGD with an  additional 10 mcg of fentanyl and 2 mg of Versed given IV.   PROCEDURE:  With the patient in the left lateral decubitus position, a  rectal exam was performed.  No masses were felt.  The Olympus scope was  inserted into the rectum and advanced into the sigmoid and up into the  descending colon;  it was advanced up to 60 cm.  The descending colon,  sigmoid and rectum looked normal.  The scope was retroflexed in the rectum;  no abnormalities were seen.  The scope was straightened and brought out.  She tolerated the procedure well without complications.   IMPRESSION:  Normal flexible sigmoidoscopy, no fissures or significant  hemorrhoidal disease were seen in the anal area.      SFG/MEDQ  D:  12/27/2004  T:  12/27/2004  Job:  [id number]   cc:   Tresa Endo L. Philipp Deputy, M.D.  [phone] [address]  [place] [address]  Fax: [phone]

## 2011-03-02 NOTE — Op Note (Signed)
[facility]  Patient:    Amy [name], Amy [name] Visit Number: [id number] MRN: [id number]          Service Type: ANT Location: [facility] Attending Physician:  Merrily Pew Dictated by:   Gaetano Hawthorne Lily Peer, M.D. Proc. Date: 02/03/02 Admit Date:  01/05/2002                             Operative Report  OPERATION:                    Amniocentesis.  SURGEON:                      Juan H. Lily Peer, M.D.  INDICATIONS:                  The patient is a 40 year old gravida 3, para 2, with estimated date of confinement of Feb 24, 2002, currently at [redacted] weeks gestation, who presented to [facility] Department of Radiology for an amniocentesis for fetal lung maturity.  DESCRIPTION OF PROCEDURE:     After a scan of the abdomen demonstrated a safe pocket for the amniocentesis in the right upper outer quadrant, the area was prepped with Betadine solution and under sonographic guidance, a 23-gauge spinal needle was inserted to the fluid pocket and approximately 15 cc of clear amniotic fluid was obtained which was submitted to [facility] for a fetal lung maturity study for PG and LS.  Pre and postamniocentesis viability was noted.  The fetus is in the vertex presentation.  The patient told to get a full scan for estimated fetal weight today, followed by an NST. The patients blood titer was A positive.  There were no complications.  Will notify patient either this evening or tomorrow if we are set to proceed with scheduled induction at the end of the week. Dictated by:   Gaetano Hawthorne Lily Peer, M.D. Attending Physician:  Merrily Pew DD:  02/03/02 TD:  02/03/02 Job: [id number]

## 2011-03-02 NOTE — Discharge Summary (Signed)
[facility]  Patient:    Amy [name], Amy [name] Visit Number: [id number] MRN: [id number]          Service Type: OBS Location: 910A 9141 [id number] Attending Physician:  Tonye Royalty Dictated by:   Antony Contras, [facility] Admit Date:  02/06/2002 Discharge Date: 02/09/2002                             Discharge Summary  DISCHARGE DIAGNOSES:          1. Intrauterine pregnancy at term.                               2. Gestational diabetic, on insulin.                               3. Suspected fetal macrosomia.  PROCEDURE:                    Primary low cervical transverse conservative with delivery of viable infant.  HISTORY OF PRESENT ILLNESS:   The patient is a 40 year old gravida 3 para 2, 0-0-2, LMP May 20, 2002, [facility] Feb 24, 2002.  Prenatal risk factors include history of gestational diabetes, insulin-dependent, history of previous positive GBS, history of previous postpartum hemorrhage with last pregnancy Clomid pregnancy.  LABORATORY DATA:              Blood type O-positive.  Antibody screen negative. RPR, HBsAg nonreactive.  HOSPITAL COURSE:              The patient was admitted for primary low cervical transverse cesarean section secondary to suspected fetal macrosomia. 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                                Postpartum course, the patient remained afebrile with no difficulty voiding, and was able to be discharged on her third postoperative day in satisfactory condition.  CBC hematocrit 30.7, hemoglobin 10.4, platelets 195,000.  DISPOSITION:                  Follow-up in six weeks.  Check blood sugars two weeks prior to postpartum visit and bring to office.  DISCHARGE MEDICATIONS:        1. Continue prenatal vitamins and iron.                               2.  Motrin and Tylox for pain. Dictated by:   Antony Contras, [facility] Attending Physician:  Tonye Royalty DD:  03/17/02 TD:  03/19/02 Job: [id number]

## 2011-03-02 NOTE — Op Note (Signed)
Amy [name], Amy [name]                         ACCOUNT NO.:  [id number]   MEDICAL RECORD NO.:  [id number]                   PATIENT TYPE:  OIB   LOCATION:  2550                                 FACILITY:  [facility]   PHYSICIAN:  Adolph Pollack, M.D.            DATE OF BIRTH:  1971/10/07   DATE OF PROCEDURE:  01/11/2003  DATE OF DISCHARGE:                                 OPERATIVE REPORT   PREOPERATIVE DIAGNOSIS:  Symptomatic cholelithiasis.   POSTOPERATIVE DIAGNOSIS:  Chronic cholecystitis with cholelithiasis.   PROCEDURE:  Laparoscopic cholecystectomy with intraoperative cholangiogram.   SURGEON:  Adolph Pollack, M.D.   ASSISTANT:  Lebron Conners, M.D.   ANESTHESIA:  General.   INDICATIONS:  The patient is a 40 year old female who had been having severe  epigastric pains radiating through the back with nausea, vomiting, and  fever.  She was evaluated in the hospital and a CT scan demonstrated an 11  cm calcification in the gallbladder consistent with cholelithiasis.  She  also has some nephrolithiasis.  Symptoms are consistent with biliary colic,  and she now presents for elective cholecystectomy.  The procedure and risks  were explained to her preoperatively.   DESCRIPTION OF PROCEDURE:  She had been seen in the holding area, then  brought to the operating room, placed supine on the operating table, and a  general anesthetic was administered.  Her abdomen was sterilely prepped and  draped.  Dilute Marcaine solution was infiltrated in the subumbilical  subcutaneous tissues and skin and a small incision made in the subumbilical  region.  Dissection was carried down to the level of the fascia, where a  small incision was made in the midline fascia.  The peritoneal cavity was  entered sharply under direct vision.  A pursestring suture of 0 Vicryl was  placed around the fascial edges.  A Hasson trocar was then introduced to the  peritoneal cavity and a pneumoperitoneum was  created by insufflation of CO2  gas.   Next a laparoscope was introduced and she was placed in the reverse  Trendelenburg position with slight leftward tilt.  Under direct vision an 11  mm trocar was placed through a similar-sized incision in the epigastrium and  two 5 mm trocars placed in the right midabdomen through similar-sized  incisions.  The gallbladder was noted and was somewhat edematous.  The  fundus was grasped and retracted toward the right shoulder.  The  infundibulum was also grasped and retracted laterally and using careful  blunt dissection and staying on the gallbladder, the infundibulum was  mobilized, exposing the gallbladder-cystic duct junction.  The cystic duct  was then isolated and a window created around it.  I also identified the  cystic artery, isolated it, and created a window around it.  A clip was then  placed just above the cystic duct-gallbladder junction and a small incision  made in the cystic duct.  The  common bile duct could be readily identified  as well.  A cholangiocatheter was passed through the anterior abdominal wall  and placed into the cystic duct, and a cholangiogram was performed.   Under real-time fluoroscopy, dilute contrast material was injected into the  cystic duct.  The common duct and hepatic ducts filled promptly and bile  drained rapidly from the common bile duct into the duodenum without obvious  outlet obstruction.  Further pictures noted the right and left hepatic ducts  under real-time fluoroscopy.  A final report is pending the radiologist's  interpretation.   Next the cholangiocatheter was removed, and the cystic duct was clipped  three times proximally and then divided.  The large anterior branch of the  cystic artery was clipped and divided.  A smaller branch was also clipped  and divided.  Using electrocautery, the gallbladder was dissected free from  the liver bed.  A small hole was made in the gallbladder, so once it  was  freed it up it was placed in an Endopouch bag.   The gallbladder fossa was inspected and bleeding points were controlled with  cautery.  There was somewhat of a raw surface, and a piece of Surgicel was  placed on this.  The perihepatic area was irrigated and the fluid evacuated.  The gallbladder fossa was inspected once again and no bleeding was noted.  No bile leak was noted.   The gallbladder was then removed through the Endopouch bag through the  subumbilical port and the subumbilical fascial defect was closed under  laparoscopic vision by tightening up and tying down the pursestring suture.  The perihepatic area was once again copiously irrigated and fluid was  evacuated until clear.  Next the trocars were removed and the  pneumoperitoneum was released.   The skin incisions were closed with 4-0 Monocryl subcuticular stitches.  Steri-Strips and sterile dressings were applied.   She tolerated the procedure well without any apparent complications and was  taken to the recovery room in satisfactory condition.                                               Adolph Pollack, M.D.    Kari Baars  D:  01/11/2003  T:  01/11/2003  Job:  [id number]   cc:   Tresa Endo L. Philipp Deputy, M.D.  [phone] [address]  [place] [address]  Fax: [phone]

## 2011-03-02 NOTE — Op Note (Signed)
[facility]  Patient:    SANORA, CUNANAN I Visit Number: [id number] MRN: [id number]          Service Type: OBS Location: 910A 9141 [id number] Attending Physician:  Tonye Royalty Dictated by:   Gaetano Hawthorne. Lily Peer, M.D. Proc. Date: 02/06/02 Admit Date:  02/06/2002                             Operative Report  PREOPERATIVE DIAGNOSES:       1. Term intrauterine pregnancy.                               2. Gestational diabetic on insulin.                               3. Suspected fetal macrosomia.  POSTOPERATIVE DIAGNOSES:      1. Term intrauterine pregnancy.                               2. Gestational diabetic on insulin.                               3. Suspected fetal macrosomia.  OPERATION:                    Primary lower uterine segment transverse                               cesarean section.  SURGEON:                      Juan H. Lily Peer, M.D.  FIRST ASSISTANT:              Katy Fitch, M.D.  ANESTHESIA:                   Spinal along with 0.25% Marcaine was infiltrated                               subcutaneously in the incision site at the time                               of closure for approximately 10 cc.  INDICATIONS FOR PROCEDURE:    A 40 year old gravida 3, para 2, at [redacted] weeks gestation.  Admitted for primary cesarean section secondary to suspected fetal macrosomia.  An ultrasound at the time of amniocentesis for fetal lung maturity demonstrated the fetus with approximate weight of 4202 g at the 97th for 37 weeks.  The LS ratio was 3.9:1 with PG present.  FINDINGS:                     A viable female infant.  Apgars of 8 and 9 with a nuchal cord x1.  Clear amniotic fluid.  Weight of 8 pounds.  Normal tubes and ovaries.  Questionable intramural leiomyomatous uteri and normal tubes and ovaries.  DESCRIPTION OF PROCEDURE:     After the patient was adequately counseled, she was taken to the operating room and had  a spinal  anesthesia placed.  A random blood sugar that was drawn was reported to be 97 mg/dl and she is on lactated Ringers for IV fluids, and her last insulin dose was at lunch time on February 05, 2002.  After the patient was in the supine position, the abdomen was prepped and draped in the usual sterile fashion.  A Foley catheter was inserted to monitor urinary output.  A Pfannenstiel skin incision was made 2 cm above the symphysis pubis.  The incision was carried down through the skin and subcutaneous tissue down to the rectus fascia whereby a midline nick was made. The fascia was incised in a transverse fashion.  The peritoneal cavity was entered cautiously and a bladder flap was established.  The lower uterine segment was incised in a transverse fashion.  Clear amniotic fluid was present and the newborn was delivered.  The cord was manually reduced on the head. The nasopharyngeal area was bulb suctioned.  The cord was doubly clamped.  The baby began to give an immediate cry and was shown to the parents and handed off to the pediatricians who were in attendance who gave the above mentioned parameters.  After cord blood was obtained, the placenta was delivered from the intrauterine cavity.  The uterus was then exteriorized and remaining products of conception were removed from the intrauterine cavity.  The lower uterine segment was closed in a single layer and locking stitch fashion with 0 Vicryl suture.  The uterus was then placed back into the abdominopelvic cavity.  The pelvic cavity was copiously irrigated with normal saline solution.  After inspection of the lower uterine segment and ascertaining adequate hemostasis, the remainder of the closure was as follows; the visceroperitoneum was now closed and the rectus fascia was closed with 0 Vicryl suture in a running stitch manner.  The subcutaneous bleeders were Bovie cauterized and the skin was reapproximated with skin clips followed by  placement of Xeroform gauze followed by dressing.  Of note, 10 cc of 0.25% Marcaine was infiltrated into the incision site for postoperative analgesia.  The patient was transferred to the recovery room with stable vital signs.  Blood loss from the procedure was 700 cc.  IV fluids consisted of 3000 cc of lactated Ringers.  Urine output was 225 cc and clear and she received 1 g of Cefotan prophylactically. Dictated by:   Gaetano Hawthorne Lily Peer, M.D. Attending Physician:  Tonye Royalty DD:  02/06/02 TD:  02/07/02 Job: [id number]

## 2011-03-02 NOTE — Op Note (Signed)
[name], [name]                         ACCOUNT NO.:  [id number]   MEDICAL RECORD NO.:  [id number]                   PATIENT TYPE:  INP   LOCATION:  9319                                 FACILITY:  [facility]   PHYSICIAN:  Charles A. Sydnee Cabal, MD            DATE OF BIRTH:  1971-03-14   DATE OF PROCEDURE:  05/28/2004  DATE OF DISCHARGE:                                 OPERATIVE REPORT   PREOPERATIVE DIAGNOSIS:  Incomplete abortion.   POSTOPERATIVE DIAGNOSIS:  Incomplete abortion.   OPERATION PERFORMED:  1. Dilation and evacuation.  2. Paracervical block.   SURGEON:  Charles A. Delcambre, MD   ASSISTANT:  None.   ANESTHESIA:  MAC.   COMPLICATIONS:  None.   ESTIMATED BLOOD LOSS:  150 mL.   SPECIMENS:  Products of conception, uterine curettings to pathology.   FINDINGS:  Small amount of products of conception?  Minimal tissue?  Consistent with complete spontaneous abortion.  Cautery of cervical friable  tissue which has previously been biopsied was done secondary to oozing? Was  this the source of some of the bleeding as noted in previous examinations?  Good hemostasis noted after cautery of these areas and the dilation and  curettage/dilation and evacuation.   DESCRIPTION OF PROCEDURE:  The patient was taken to the operating room and  placed in supine position.  Sedation was then accomplished.  She was then  placed in dorsal lithotomy position in universal stirrups.  Tenaculum was  used on the anterior lip of the cervix.  Paracervical block of 0.25% plain  Marcaine was placed at 4 o'clock and 8 o'clock divided 18 mL equally.  Hegar  dilators were used to dilate.  Dilator sound was to about 8 cm.  There was  no evidence of perforation.  A 7 mm curet was placed with 50 cm Hg of  suction. A very small amount of tissue was aspirated on the first and second  passes.  The third pass yielded no further tissue.  Gentle exploration with  banjo curet yielded no further significant  tissue.  A final pass with a  suction curet yielded no further tissue.  Procedure was then turned over to  the cautery as described above.  Good hemostasis was noted after that and  the procedure was then terminated.  Tenaculum sites were of good hemostasis.  The patient was then transferred to the recovery room with cessation, having  tolerated the procedure well.                                              Charles A. Sydnee Cabal, MD   CAD/MEDQ  D:  05/28/2004  T:  05/29/2004  Job:  [id number]

## 2011-04-18 ENCOUNTER — Emergency Department (HOSPITAL_COMMUNITY)
Admission: EM | Admit: 2011-04-18 | Discharge: 2011-04-19 | Disposition: A | Discharge status: Home / Self Care | Payer: Self-pay | Attending: Emergency Medicine | Admitting: Emergency Medicine

## 2011-04-18 DIAGNOSIS — H8110 Benign paroxysmal vertigo, unspecified ear: Secondary | ICD-10-CM | POA: Insufficient documentation

## 2011-04-18 DIAGNOSIS — I10 Essential (primary) hypertension: Secondary | ICD-10-CM | POA: Insufficient documentation

## 2011-04-18 DIAGNOSIS — R51 Headache: Secondary | ICD-10-CM | POA: Insufficient documentation

## 2011-04-18 DIAGNOSIS — E119 Type 2 diabetes mellitus without complications: Secondary | ICD-10-CM | POA: Insufficient documentation

## 2011-07-17 LAB — URINALYSIS, ROUTINE W REFLEX MICROSCOPIC
Bilirubin Urine: NEGATIVE
Glucose, UA: NEGATIVE
Ketones, ur: NEGATIVE
pH: 6

## 2011-07-17 LAB — COMPREHENSIVE METABOLIC PANEL
ALT: 12
AST: 18
Albumin: 3.4 — ABNORMAL LOW
CO2: 20
Calcium: 9
GFR calc Af Amer: >60
GFR calc non Af Amer: >60
Sodium: 139
Total Protein: 6.7

## 2011-07-17 LAB — DIFFERENTIAL
Eosinophils Absolute: 0.2
Eosinophils Relative: 1
Lymphocytes Relative: 27
Lymphs Abs: 3.1
Monocytes Absolute: 0.7
Monocytes Relative: 6

## 2011-07-17 LAB — URINE MICROSCOPIC-ADD ON

## 2011-07-17 LAB — CBC
MCHC: 34.1
Platelets: 278
RBC: 4.24
WBC: 11.3 — ABNORMAL HIGH

## 2011-07-19 LAB — DIFFERENTIAL
Lymphs Abs: 1.1 10*3/uL (ref 0.7–4.0)
Monocytes Absolute: 0.4 10*3/uL (ref 0.1–1.0)
Monocytes Relative: 2 % — ABNORMAL LOW (ref 3–12)
Neutro Abs: 16.8 10*3/uL — ABNORMAL HIGH (ref 1.7–7.7)
Neutrophils Relative %: 91 % — ABNORMAL HIGH (ref 43–77)

## 2011-07-19 LAB — OCCULT BLOOD X 1 CARD TO LAB, STOOL: Fecal Occult Bld: NEGATIVE

## 2011-07-19 LAB — URINALYSIS, ROUTINE W REFLEX MICROSCOPIC
Bilirubin Urine: NEGATIVE
Glucose, UA: NEGATIVE mg/dL
Hgb urine dipstick: NEGATIVE
Ketones, ur: 15 mg/dL — AB
Specific Gravity, Urine: 1.016 (ref 1.005–1.030)
pH: 8.5 — ABNORMAL HIGH (ref 5.0–8.0)

## 2011-07-19 LAB — RAPID STREP SCREEN (MED CTR MEBANE ONLY): Streptococcus, Group A Screen (Direct): POSITIVE — AB

## 2011-07-19 LAB — GLUCOSE, CAPILLARY: Glucose-Capillary: 141 mg/dL — ABNORMAL HIGH (ref 70–99)

## 2011-07-19 LAB — COMPREHENSIVE METABOLIC PANEL
ALT: 23 U/L (ref 0–35)
Albumin: 3.8 g/dL (ref 3.5–5.2)
Alkaline Phosphatase: 84 U/L (ref 39–117)
BUN: 6 mg/dL (ref 6–23)
Calcium: 8.7 mg/dL (ref 8.4–10.5)
Glucose, Bld: 133 mg/dL — ABNORMAL HIGH (ref 70–99)
Potassium: 3.2 mEq/L — ABNORMAL LOW (ref 3.5–5.1)
Sodium: 133 mEq/L — ABNORMAL LOW (ref 135–145)
Total Protein: 6.9 g/dL (ref 6.0–8.3)

## 2011-07-19 LAB — CBC
Hemoglobin: 14 g/dL (ref 12.0–15.0)
MCHC: 34.2 g/dL (ref 30.0–36.0)
Platelets: 300 10*3/uL (ref 150–400)
RDW: 13.1 % (ref 11.5–15.5)

## 2011-07-19 LAB — URINE MICROSCOPIC-ADD ON

## 2011-08-01 LAB — HEPATIC FUNCTION PANEL
Alkaline Phosphatase: 64
Bilirubin, Direct: <0.1
Total Bilirubin: 0.4

## 2011-08-01 LAB — URINALYSIS, ROUTINE W REFLEX MICROSCOPIC
Bilirubin Urine: NEGATIVE
Glucose, UA: NEGATIVE
Ketones, ur: 15 — AB
Nitrite: NEGATIVE
Protein, ur: NEGATIVE
Specific Gravity, Urine: 1.02
Urobilinogen, UA: 0.2
pH: 6

## 2011-08-01 LAB — I-STAT 8, (EC8 V) (CONVERTED LAB)
Acid-base deficit: 4 — ABNORMAL HIGH
BUN: 7
Bicarbonate: 19 — ABNORMAL LOW
Chloride: 109
Glucose, Bld: 136 — ABNORMAL HIGH
HCT: 42
Hemoglobin: 14.3
Operator id: 196461
Potassium: 3.9
Sodium: 139
TCO2: 20
pCO2, Ven: 29 — ABNORMAL LOW
pH, Ven: 7.424 — ABNORMAL HIGH

## 2011-08-01 LAB — POCT I-STAT CREATININE
Creatinine, Ser: 0.8
Operator id: 196461

## 2011-08-01 LAB — URINE CULTURE: Colony Count: 25000

## 2011-08-01 LAB — CBC
HCT: 38
Hemoglobin: 12.7
MCHC: 33.3
MCV: 78.8
Platelets: 246
RBC: 4.82
RDW: 16.7 — ABNORMAL HIGH
WBC: 3.6 — ABNORMAL LOW

## 2011-08-01 LAB — URINE MICROSCOPIC-ADD ON

## 2011-08-01 LAB — DIFFERENTIAL
Basophils Absolute: 0.1
Basophils Relative: 3 — ABNORMAL HIGH
Eosinophils Absolute: 0.1
Eosinophils Relative: 2
Lymphocytes Relative: 49 — ABNORMAL HIGH
Lymphs Abs: 1.8
Monocytes Absolute: 0.6
Monocytes Relative: 16 — ABNORMAL HIGH
Neutro Abs: 1.1 — ABNORMAL LOW
Neutrophils Relative %: 30 — ABNORMAL LOW

## 2011-08-01 LAB — POCT PREGNANCY, URINE
Operator id: 196461
Preg Test, Ur: NEGATIVE

## 2011-08-01 LAB — LIPASE, BLOOD: Lipase: 19

## 2012-10-15 DIAGNOSIS — E2839 Other primary ovarian failure: Secondary | ICD-10-CM

## 2012-10-15 HISTORY — DX: Other primary ovarian failure: E28.39

## 2013-02-20 ENCOUNTER — Ambulatory Visit: Payer: Self-pay | Admitting: Gynecology

## 2013-03-20 ENCOUNTER — Encounter: Payer: Self-pay | Admitting: Women's Health

## 2013-03-20 ENCOUNTER — Ambulatory Visit (INDEPENDENT_AMBULATORY_CARE_PROVIDER_SITE_OTHER): Payer: Self-pay | Admitting: Women's Health

## 2013-03-20 VITALS — BP 152/94 | Ht 63.5 in | Wt 192.0 lb

## 2013-03-20 DIAGNOSIS — N946 Dysmenorrhea, unspecified: Secondary | ICD-10-CM

## 2013-03-20 MED ORDER — IBUPROFEN 600 MG PO TABS: 600.0000 mg | ORAL_TABLET | Freq: Three times a day (TID) | ORAL | Status: DC | PRN

## 2013-03-20 NOTE — Progress Notes (Signed)
Patient ID: Amy [name], female   DOB: [birth year], 42 y.o.   MRN: [id number] Presents with complaint of irregular cycles. States has always had irregular cycles since starting at age 46. Uses no contraception. Had been on birth control pills in the past but is now hypertensive on lisinopril 10 milligrams daily. [facility] [address]. Normal cycle in January and February, no cycle March or April, cycle in May lasted greater than 7 days, no spotting between cycles, May cycle was heavy changing pads every 2-3 hours, with cramps, June cycle also heavy with cramping lasted for 5 days. Denies pain today, discharge, urinary symptoms, same partner. Would like to conceive again. History of gestational diabetes. Children are now 21, 15, and 11. Normal Pap last year. Questions what can do for pain.  Exam: Appears well, external genitalia within normal limits, speculum exam no visible menses, cervix pink healthy without lesion or discharge. Bimanual no CMT or adnexal fullness or tenderness with exam, exam somewhat limited due to abdominal girth.  Dysmenorrhea with menorrhagia/irregular cycles always Hypertension History of gestational diabetes  Plan: Motrin 600 every 8 hours as needed for dysmenorrhea, if continues with problem. Instructed to return to office for ultrasound. Continue prenatal vitamin daily. Reviewed with need to change blood pressure medication if became pregnant. Has had a fertility issues in the past, reviewed may not be the best option to pursue fertility management due to age and hypertension.  Mirena IUD information in Spanish given and reviewed, will contemplate return to office for Dr. Lily Peer to place.

## 2014-08-16 ENCOUNTER — Encounter: Payer: Self-pay | Admitting: Women's Health

## 2015-07-29 ENCOUNTER — Encounter: Payer: Self-pay | Admitting: Gynecology

## 2015-07-29 ENCOUNTER — Ambulatory Visit (INDEPENDENT_AMBULATORY_CARE_PROVIDER_SITE_OTHER): Payer: Self-pay | Admitting: Gynecology

## 2015-07-29 ENCOUNTER — Other Ambulatory Visit (HOSPITAL_COMMUNITY)
Admission: RE | Admit: 2015-07-29 | Discharge: 2015-07-29 | Disposition: A | Discharge status: Home / Self Care | Payer: Self-pay | Source: Ambulatory Visit | Attending: Gynecology | Admitting: Gynecology

## 2015-07-29 VITALS — BP 146/90 | Ht 64.5 in | Wt 192.0 lb

## 2015-07-29 DIAGNOSIS — R300 Dysuria: Secondary | ICD-10-CM

## 2015-07-29 DIAGNOSIS — N898 Other specified noninflammatory disorders of vagina: Secondary | ICD-10-CM

## 2015-07-29 DIAGNOSIS — Z01419 Encounter for gynecological examination (general) (routine) without abnormal findings: Secondary | ICD-10-CM

## 2015-07-29 DIAGNOSIS — N912 Amenorrhea, unspecified: Secondary | ICD-10-CM | POA: Insufficient documentation

## 2015-07-29 DIAGNOSIS — R350 Frequency of micturition: Secondary | ICD-10-CM

## 2015-07-29 DIAGNOSIS — IMO0001 Reserved for inherently not codable concepts without codable children: Secondary | ICD-10-CM

## 2015-07-29 DIAGNOSIS — Z1151 Encounter for screening for human papillomavirus (HPV): Secondary | ICD-10-CM | POA: Insufficient documentation

## 2015-07-29 LAB — URINALYSIS W MICROSCOPIC + REFLEX CULTURE
BILIRUBIN URINE: NEGATIVE
CASTS: NONE SEEN [LPF]
CRYSTALS: NONE SEEN [HPF]
Glucose, UA: NEGATIVE
Hgb urine dipstick: NEGATIVE
Ketones, ur: NEGATIVE
NITRITE: NEGATIVE
YEAST: NONE SEEN [HPF]
pH: 5.5 (ref 5.0–8.0)

## 2015-07-29 LAB — PREGNANCY, URINE: PREG TEST UR: NEGATIVE

## 2015-07-29 LAB — WET PREP FOR TRICH, YEAST, CLUE
Clue Cells Wet Prep HPF POC: NONE SEEN
Trich, Wet Prep: NONE SEEN

## 2015-07-29 MED ORDER — PHENAZOPYRIDINE HCL 200 MG PO TABS: 200.0000 mg | ORAL_TABLET | Freq: Three times a day (TID) | ORAL | Status: DC | PRN

## 2015-07-29 MED ORDER — FLUCONAZOLE 150 MG PO TABS: 150.0000 mg | ORAL_TABLET | Freq: Once | ORAL | Status: DC

## 2015-07-29 NOTE — Patient Instructions (Signed)
Fluconazole injection Qu es este medicamento? El FLUCONAZOL es un medicamento antimictico. Se utiliza para tratar o prevenir ciertos tipos de infecciones micticas o por levadura. Este medicamento puede ser utilizado para otros usos; si tiene alguna pregunta consulte con su proveedor de atencin mdica o con su farmacutico. Qu le debo informar a mi profesional de la salud antes de tomar este medicamento? Necesita saber si usted presenta alguno de los siguientes problemas o situaciones: -antecedentes de pulso cardaco irregular -enfermedad renal -una reaccin alrgica o inusual al fluconazol, a otros medicamentos antimicticos, alimentos, colorantes o conservantes -si est embarazada o buscando quedar embarazada -si est amamantando a un beb Cmo debo utilizar este medicamento? Este medicamento se administra mediante inyeccin por va intravenosa. Generalmente lo administra un profesional de Technical sales engineer en un hospital o en un entorno clnico. Si recibe este medicamento en su domicilio, se le ensearn cmo preparar y Biomedical engineer. selo exactamente como se le indique. Use sus dosis a intervalos regulares. No use su medicamento con una frecuencia mayor a la indicada. Es importante que deseche las agujas y las jeringas usadas en un recipiente resistente a los pinchazos. No las deseche en una basura. Si no tiene un recipiente resistente a los pinchazos, consulte a Midwife o su proveedor de atencin para obtenerlo. Hable con su pediatra para informarse acerca del uso de este medicamento en nios. Puede requerir atencin especial. Sobredosis: Pngase en contacto inmediatamente con un centro toxicolgico o una sala de urgencia si usted cree que haya tomado demasiado medicamento. ATENCIN: [employer] es solo para usted. No comparta este medicamento con nadie. Qu sucede si me olvido de una dosis? No se aplica en este caso. Qu puede interactuar con este  medicamento? No tome esta medicina con ninguno de los siguientes medicamentos: -astemizol -ciertos medicamentos para el pulso cardiaco irregular, tales como dofetilida, dronedarona, quinidina -cisapride -eritromicina -lomitapida -otros medicamentos que prolongan el intervalo QT (causa un ritmo cardiaco anormal) -pimozida -terfenadina -tioridazina -tolvaptn -ziprasidona Esta medicina tambin puede interactuar con los siguientes medicamentos: -medicamentos antivirales para el VIH o SIDA -pldoras anticonceptivas -ciertos antibiticos, tales como rifabutina, rifampicina -ciertos medicamentos para la presin sangunea, tales como amlodipina, isradipina, felodipino, hidroclorotiazida, losartn, nifedipina -ciertos medicamentos para el cncer, tales como ciclofosfamida, vinblastina, vincristina -ciertos medicamentos para el colesterol, tales como atorvastatina, lovastatina, fluvastatina, simvastatina -ciertos medicamentos para la depresin, ansiedad o trastornos psicticos, tales como amitriptilina, midazolam, nortriptilina, triazolam -ciertos medicamentos para la diabetes, tales como glipizida, gliburida, tolbutamida -ciertos medicamentos para Conservation officer, historic buildings, tales como alfentanilo, fentanilo, metadona -ciertos medicamentos para convulsiones, tales como carbamazepina, fenitona -ciertos medicamentos que tratan o previenen cogulos sanguneos, tales como warfarina -halofantrina -medicamentos que reducen su capacidad de combatir infecciones, tales como ciclosporina, prednisona, tacrolimo -los AINE, medicamentos para el dolor o inflamacin, tales como celecoxib, diclofenaco, flurbiprofeno, ibuprofeno, meloxicam, naproxeno -otros medicamentos para infecciones micticas -sirolims -teofilina -tofacitinib Puede ser que esta lista no menciona todas las posibles interacciones. Informe a su profesional de [employer]	[place] de [employer] productos a base de hierbas, medicamentos de [place] o suplementos  nutritivos que est tomando. Si usted fuma, consume bebidas alcohlicas o si utiliza drogas ilegales, indqueselo tambin a su profesional de [employer]	[place]. Algunas sustancias pueden interactuar con su medicamento. A qu debo estar atento al usar [employer]? Si los sntomas no mejoran, consulte a Adult nurse. Si toma este medicamento durante un perodo de [employer], podr Customer service manager anlisis de [place]. Pueden transcurrir [employer] o meses de tratamiento antes de que algunas  infecciones micticas se curen. El alcohol puede aumentar la posibilidad de daos al hgado de [place]. Evite consumir bebidas alcohlicas. Qu efectos secundarios puedo tener al [employer] este medicamento? Efectos secundarios que debe informar a su mdico o a Barrister's clerk de la salud tan pronto como sea posible: -Chief of Staff como erupcin cutnea, picazn o urticarias, hinchazn de los labios, boca, lengua o garganta -orina de color amarillo oscuro -sensacin de mareos o desmayos -pulso cardiaco irregular o dolor en el pecho -dolor, enrojecimiento en el lugar de la inyeccin -enrojecimiento, formacin de ampollas, descamacin o distensin de la piel, inclusive dentro de la boca -dolor estomacal -dificultad al respirar -sangrado o magulladuras inusuales -vmito -color amarillento de los ojos o la piel Efectos secundarios que, por lo general, no requieren Geophysical data processor (debe informarlos a su mdico o a Barrister's clerk de la salud si persisten o si son molestos): -cambios en el sabor de los alimentos -diarrea -dolor de cabeza -Higher education careers adviser, nuseas Puede ser que esta lista no menciona todos los posibles efectos secundarios. Comunquese a su mdico por asesoramiento mdico [employer]. Usted puede informar los efectos secundarios a la FDA por telfono al 1-800-FDA-1088. Dnde debo guardar mi medicina? Mantngala fuera del alcance de los nios. Si est  Theatre manager en su domicilio recibir instrucciones sobre cmo guardar este medicamento. Deseche todo el medicamento que no haya utilizado, despus de la fecha de vencimiento. ATENCIN: Este folleto es un resumen. Puede ser que no cubra toda la posible informacin. Si usted tiene preguntas acerca de esta medicina, consulte con su mdico, su farmacutico o su profesional de Technical sales engineer.    2016, Elsevier/Gold Standard. (2014-11-23 00:00:00) Vaginitis monilisica (Monilial Vaginitis) La vaginitis es una inflamacin (irritacin, hinchazn) de la vagina y la vulva. Esta no es una enfermedad de transmisin sexual.  CAUSAS Este tipo de vaginitis lo causa un hongo (candida) que normalmente se encuentra en la vagina. El hongo candida se ha desarrollado hasta el punto de ocasionar problemas en el equilibrio qumico. SNTOMAS  Secrecin vaginal espesa y blanca.  Hinchazn, picazn, enrojecimiento e inflamacin de la vagina y en algunos casos de los labios vaginales (vulva).  Ardor o dolor al [employer].  Dolor en [place]. DIAGNSTICO Los factores que favorecen la vaginitis moniliasica son:  Kyla Balzarine de virginidad y postmenopusicas.  Embarazo.  Infecciones.  Sentir cansancio, estar enferma o estresada, especialmente si ya ha sufrido este problema en el pasado.  Diabetes Buen control ayudar a disminur la probabilidad.  Pldoras anticonceptivas  Ropa interior [place].  El uso de espumas de bao, aerosoles femeninos duchas vaginales o tampones con desodorante.  Algunos antibiticos (medicamentos que destruyen grmenes).  Si contrae alguna enfermedad puede sufrir recurrencias espordicas. [place] profesional que lo asiste prescribir medicamentos.  Hay diferentes tipos de cremas y supositorios vaginales que tratan especficamente la vaginitis monilisica. Para infecciones por hongos recurrentes, utilice un supositorio o crema en la vagina dos veces por  semana, o segn se le indique.  Tambin podrn utilizarse cremas con corticoides o anti monilisicas para la picazn o la irritacin de la vulva. Consulte con el profesional que la asiste.  Si la crema no da resultado, podr aplicarse en la vagina una solucin con azul de metileno.  El consumo de yogur puede prevenir este tipo de vaginitis. INSTRUCCIONES [place] todos los medicamentos tal como se le indic.  No mantenga relaciones sexuales hasta que el tratamiento se haya completado, o segn las indicaciones  del profesional que la asiste.  Tome baos de asiento tibios.  No se aplique duchas vaginales.  No utilice tampones, especialmente los perfumados.  Use ropa interior de algodn  [employer] pantalones ajustados y las medias tipo panty.  Comunique a sus compaeros sexuales que sufre una infeccin por hongos. Ellos deben concurrir para un control mdico si tienen sntomas como una urticaria leve o picazn.  Sus compaeros sexuales deben tratarse tambin si la infeccin es difcil de Radiographer, therapeutic.  Practique el sexo seguro - use condones  Algunos medicamentos vaginales ocasionan fallas en los condones de ltex. Los medicamentos vaginales que pueden daar los condones son:  Building services engineer cleocina  Butoconazole (Femstat)  Terconazole (Terazol) supositorios vaginales  Miconazole (Monistat) (es un medicamento de venta libre) SOLICITE ATENCIN MDICA SI:  Waldron Session tiene una temperatura oral de ms de 38,9 C (102 F).  Si la infeccin empeora luego de 2 das de tratamiento.  Si la infeccin no mejora luego de 3 das de tratamiento.  Aparecen ampollas en o alrededor de la vagina.  Si aparece una hemorragia vaginal y no es el momento del perodo.  Siente dolor al [employer].  Presenta problemas intestinales.  Tiene dolor durante las [employer].   Esta informacin no tiene Marine scientist el consejo del mdico. Asegrese de hacerle al mdico  cualquier pregunta que tenga.   Document Released: 07/11/2005 Document Revised: 12/24/2011 Elsevier Interactive Patient Education [employer].

## 2015-07-29 NOTE — Addendum Note (Signed)
Addended by: Thurnell Garbe A on: 07/29/2015 10:17 AM   Modules accepted: Orders, SmartSet

## 2015-07-29 NOTE — Progress Notes (Signed)
Amy [name] 1971-10-05 [id number]   History:    44 y.o.  for annual gyn exam who has not been seen in the office for several years had several complaints. Patient was having some slight vaginal irritation. Patient stated that she had gone to an indigent medical clinic whereby she is being treated for hypertension and type 2 diabetes. She was recently treated for suspected UTI. She has had some dysuria and frequency on and off. Patient also has not had a menstrual cycle since 2014?Marland Kitchen She denies any nipple discharge. She does complain of headaches every other day. No double vision reported. Patient with no past history of any abnormal Pap smear. Patient has not had her screening mammogram. Patient has not been using any form of contraception.  Patient states that she has an appointment with the urologist on Monday that was coordinated through her clinic as a result of her symptoms of urinary tract infection despite recently been treated with Septra DS for one week.  Past medical history,surgical history, family history and social history were all reviewed and documented in the EPIC chart.  Gynecologic History No LMP recorded. Contraception: none Last Pap: 2014. Results were: Patient stated that it was normal Last mammogram: No previous studies. Results were: No previous studies  Obstetric History OB History  Gravida Para Term Preterm AB SAB TAB Ectopic Multiple Living  4 3   1 1    3     # Outcome Date GA Lbr Len/2nd Weight Sex Delivery Anes PTL Lv  4 SAB           3 Para           2 Para           1 Para                ROS: A ROS was performed and pertinent positives and negatives are included in the history.  GENERAL: No fevers or chills. HEENT: No change in vision, no earache, sore throat or sinus congestion. NECK: No pain or stiffness. CARDIOVASCULAR: No chest pain or pressure. No palpitations. PULMONARY: No shortness of breath, cough or wheeze. GASTROINTESTINAL: No abdominal  pain, nausea, vomiting or diarrhea, melena or bright red blood per rectum. GENITOURINARY: No urinary frequency, urgency, hesitancy or dysuria. MUSCULOSKELETAL: No joint or muscle pain, no back pain, no recent trauma. DERMATOLOGIC: No rash, no itching, no lesions. ENDOCRINE: No polyuria, polydipsia, no heat or cold intolerance. No recent change in weight. HEMATOLOGICAL: No anemia or easy bruising or bleeding. NEUROLOGIC: No headache, seizures, numbness, tingling or weakness. PSYCHIATRIC: No depression, no loss of interest in normal activity or change in sleep pattern.     Exam: chaperone present  BP 146/90 mmHg  Ht 5' 4.5" (1.638 m)  Wt 192 lb (87.091 kg)  BMI 32.46 kg/m2  Body mass index is 32.46 kg/(m^2).  General appearance : Well developed well nourished female. No acute distress HEENT: Eyes: no retinal hemorrhage or exudates,  Neck supple, trachea midline, no carotid bruits, no thyroidmegaly Lungs: Clear to auscultation, no rhonchi or wheezes, or rib retractions  Heart: Regular rate and rhythm, no murmurs or gallops Breast:Examined in sitting and supine position were symmetrical in appearance, no palpable masses or tenderness,  no skin retraction, no nipple inversion, no nipple discharge, no skin discoloration, no axillary or supraclavicular lymphadenopathy Abdomen: no palpable masses or tenderness, no rebound or guarding Extremities: no edema or skin discoloration or tenderness  Pelvic:  Bartholin, Urethra, Skene Glands:  Within normal limits             Vagina: No gross lesions or discharge  Cervix: No gross lesions or discharge  Uterus  anteverted, normal size, shape and consistency, non-tender and mobile  Adnexa  Without masses or tenderness  Anus and perineum  normal   Rectovaginal  normal sphincter tone without palpated masses or tenderness             Hemoccult not indicated   Patient's wet prep demonstrated few yeast  Urinalysis: 6-10 WBC, many bacteria, 0-2 RBC  culture  pending  Urine pregnancy test today negative  Assessment/Plan:  44 y.o. female for annual exam with several issues: #1 secondary amenorrhea recent blood work by PCP included TSH. Her hemoglobin A1c was found to be elevated at 6.0 she's currently on metformin 500 mg twice a day. Patient has follow-up in a few months. We are going to check her prolactin level as well as her Mildred. She was provided with a prescription for Provera to take 1 tablet daily for 10 days if the results are normal on Monday. I have given her refills for her to take the Provera for 10 days of each month if she does not have a spontaneous menses every 30 days providing that she does a home urine pregnancy test and is negative before taking medication. #2 recurrent UTI will await for culture and sensitivity from today's urine meanwhile she will be prescribed pre-and 200 mg to take 1 by mouth 3 times a day for the next 3 days. #3 patient was provided with an application for scholarship programs for her to receive her mammogram at no cost #4 Pap smear was done today #5 for yeast infection she was prescribe Diflucan 150 mg 1 by mouth today.  A copy of the urinalysis and information for the urologist to see on Monday was provided for the patient to share with him.   Terrance Mass MD, 9:57 AM 07/29/2015

## 2015-07-30 LAB — URINE CULTURE

## 2015-07-30 LAB — PROLACTIN: PROLACTIN: 2.5 ng/mL

## 2015-07-30 LAB — FOLLICLE STIMULATING HORMONE: FSH: 47.6 m[IU]/mL

## 2015-08-01 ENCOUNTER — Other Ambulatory Visit: Payer: Self-pay | Admitting: Gynecology

## 2015-08-01 DIAGNOSIS — E349 Endocrine disorder, unspecified: Secondary | ICD-10-CM

## 2015-08-01 LAB — CYTOLOGY - PAP

## 2015-08-12 ENCOUNTER — Other Ambulatory Visit: Payer: Self-pay

## 2015-08-12 DIAGNOSIS — E349 Endocrine disorder, unspecified: Secondary | ICD-10-CM

## 2015-08-13 LAB — FOLLICLE STIMULATING HORMONE: FSH: 50.7 m[IU]/mL

## 2015-08-15 ENCOUNTER — Encounter: Payer: Self-pay | Admitting: Gynecology

## 2015-08-16 ENCOUNTER — Telehealth: Payer: Self-pay | Admitting: *Deleted

## 2015-08-16 ENCOUNTER — Other Ambulatory Visit: Payer: Self-pay | Admitting: *Deleted

## 2015-08-16 DIAGNOSIS — Z01419 Encounter for gynecological examination (general) (routine) without abnormal findings: Secondary | ICD-10-CM

## 2015-08-16 NOTE — Telephone Encounter (Signed)
Per result note on 08/12/15 you wanted pt to return for TSH and fasting blood sugar, pt had this done in August. Pt said she is going to hold off on the OV now and wait until blood work returns, she is coming 08/22/15 @ 9:00 am to have TSH FBS drawn.

## 2015-08-22 ENCOUNTER — Other Ambulatory Visit: Payer: Self-pay

## 2015-08-22 DIAGNOSIS — Z01419 Encounter for gynecological examination (general) (routine) without abnormal findings: Secondary | ICD-10-CM

## 2015-08-23 ENCOUNTER — Other Ambulatory Visit: Payer: Self-pay | Admitting: Gynecology

## 2015-08-23 DIAGNOSIS — R7309 Other abnormal glucose: Secondary | ICD-10-CM

## 2015-08-23 LAB — GLUCOSE, FASTING: Glucose, Fasting: 122 mg/dL — ABNORMAL HIGH (ref 65–99)

## 2015-08-23 LAB — TSH: TSH: 2.37 u[IU]/mL (ref 0.350–4.500)

## 2015-09-07 ENCOUNTER — Other Ambulatory Visit: Payer: Self-pay

## 2015-09-07 DIAGNOSIS — R7309 Other abnormal glucose: Secondary | ICD-10-CM

## 2015-09-07 LAB — HEMOGLOBIN A1C
Hgb A1c MFr Bld: 6.2 % — ABNORMAL HIGH (ref <5.7)
Mean Plasma Glucose: 131 mg/dL — ABNORMAL HIGH (ref <117)

## 2015-11-07 ENCOUNTER — Ambulatory Visit: Payer: Self-pay | Admitting: Gynecology

## 2015-11-07 ENCOUNTER — Encounter: Payer: Self-pay | Admitting: Gynecology

## 2015-11-07 VITALS — BP 128/80 | Ht 64.0 in | Wt 192.0 lb

## 2015-11-07 DIAGNOSIS — E288 Other ovarian dysfunction: Principal | ICD-10-CM

## 2015-11-07 DIAGNOSIS — E2839 Other primary ovarian failure: Secondary | ICD-10-CM

## 2015-11-07 MED ORDER — ESTRADIOL 0.5 MG PO TABS: 0.5000 mg | ORAL_TABLET | Freq: Every day | ORAL | Status: DC

## 2015-11-07 MED ORDER — MEDROXYPROGESTERONE ACETATE 10 MG PO TABS
ORAL_TABLET | ORAL | Status: DC
Start: 2015-11-07 — End: 2016-11-30

## 2015-11-07 NOTE — Patient Instructions (Signed)
Medroxyprogesterone tablets Qu es este medicamento? La MEDROXIPROGESTERONA es una hormona de la clase llamada progestinas. Se utiliza comnmente para Producer, television/film/video excesivo del revestimiento uterino en las mujeres tomando un estrgeno despus de la menopausia. Adems se [place] para tratar el sangrado menstrual irregular o la ausencia de sangrado menstrual en mujeres. Este medicamento puede ser utilizado para otros usos; si tiene alguna pregunta consulte con su proveedor de atencin mdica o con su farmacutico. Qu le debo informar a mi profesional de la salud antes de tomar este medicamento? Necesita saber si usted presenta alguno de los siguientes problemas o situaciones: -enfermedad vascular o antecedente de cogulos sanguneos en los pulmones o las piernas -cncer de mama, cervical o vaginal -enfermedad cardiaca -enfermedad renal -enfermedad heptica -aborto espontneo o aborto reciente -depresin mental -migraa -convulsiones -derrame cerebral -sangrado vaginal que no ha sido evaluado -una Risk analyst o inusual a la medroxiprogesterona, a otros medicamentos, alimentos, colorantes o conservantes -si est embarazada o buscando quedar embarazada -si est amamantando a un beb Cmo debo utilizar este medicamento? Tome este medicamento por va oral con un vaso de agua. Siga las instrucciones de la etiqueta del [place]. Tome sus dosis a intervalos regulares. No tome su medicamento con una frecuencia mayor a la indicada. Hable con su pediatra para informarse acerca del uso de este medicamento en nios. Puede requerir atencin especial. Aunque este medicamento ha sido recetado a nios tan menores como de 13 aos de edad para condiciones selectivas, las precauciones se aplican. Sobredosis: Pngase en contacto inmediatamente con un centro toxicolgico o una sala de urgencia si usted cree que haya tomado demasiado medicamento. ATENCIN: [employer] es solo para usted.  No comparta este medicamento con nadie. Qu sucede si me olvido de una dosis? Si olvida una dosis, tmela lo antes posible. Si es casi la hora de la prxima dosis, tome slo esa dosis. No tome dosis adicionales o dobles. Qu puede interactuar con este medicamento? -barbitricos para inducir el sueo o para tratar los convulsiones -bosentano -carbamazepina -fenitona -rifampicina -hierba de [place] Puede ser que esta lista no menciona todas las posibles interacciones. Informe a su profesional de KB Home	[place] de [employer] productos a base de hierbas, medicamentos de [place] o suplementos nutritivos que est tomando. Si usted fuma, consume bebidas alcohlicas o si utiliza drogas ilegales, indqueselo tambin a su profesional de KB Home	[place]. Algunas sustancias pueden interactuar con su medicamento. A qu debo estar atento al usar Coca-Cola? Visite a su profesional de la salud para Immunologist. Deber hacerse exmenes de las mamas y la pelvis en forma regular. Si piensa que est embarazada deje de tomar este medicamento de inmediato y comunquese con su mdico o con su profesional de Technical sales engineer. Qu efectos secundarios puedo tener al [employer] este medicamento? Efectos secundarios que debe informar a su mdico o a Barrister's clerk de la salud tan pronto como sea posible: -secreciones o sensibilidad de las mamas -cambios de estados de nimo o emociones, tal como depresin -cambios en la visin o el habla -dolor en el abdomen, pecho, entrepierna o piernas -dolor de cabeza severo -erupcin cutnea, picazn o urticarias -falta de aliento repentina -cansancio o debilidad inusual -color amarillento de ojos o piel Efectos secundarios que, por lo general, no requieren atencin mdica (debe informarlos a su mdico o a su profesional de la salud si persisten o si son molestos): -acn -cambios en el sangrado vaginal -cambios en el deseo sexual -crecimiento de vello en el  rostro -  retencin de lquidos e hinchazn -dolor de cabeza -Higher education careers adviser -aumento o prdida de peso Puede ser que esta lista no menciona todos los posibles efectos secundarios. Comunquese a su mdico por asesoramiento mdico [employer]. Usted puede informar los efectos secundarios a la FDA por telfono al 1-800-FDA-1088. Dnde debo guardar mi medicina? Mantngala fuera del alcance de los nios. Gurdela a [employer], entre 20 y 25 grados C (70 y 51 grados F). Deseche todo medicamento sin utilizar despus de su fecha de vencimiento. ATENCIN: Este folleto es un resumen. Puede ser que no cubra toda la posible informacin. Si usted tiene preguntas acerca de esta medicina, consulte con su mdico, su farmacutico o su profesional de Technical sales engineer.    2016, Elsevier/Gold Standard. (2014-11-23 00:00:00) Estradiol tablets Qu es este medicamento? El ESTRADIOL es un estrgeno. Se utiliza como terapia de reemplazo hormonal en mujeres que estn experimentando la menopausia. Ayuda a tratar los sofocos y a prevenir la osteoporosis. Se utiliza tambin para tratar a mujeres con bajos niveles de estrgeno o aquellas que le han extirpado sus ovarios. Este medicamento puede ser utilizado para otros usos; si tiene alguna pregunta consulte con su proveedor de atencin mdica o con su farmacutico. Qu le debo informar a mi profesional de la salud antes de tomar este medicamento? Necesita saber si usted presenta alguno de los siguientes problemas o situaciones: -sangrado vaginal anormal -enfermedad vascular o cogulos sanguneos -cncer de mama, cervical, endometrio, ovario, hgado o uterino -demencia -diabetes -enfermedad de la vescula biliar -enfermedad cardiaca o ataque cardiaco reciente -alta presin sangunea -niveles elevados de colesterol -nivel elevado de calcio en la sangre -histerectoma -enfermedad renal -enfermedad heptica -migraas -deficiencia de protena  C -deficiencia de protena S -derrame cerebral -lupus eritematoso sistmico (LES) -fuma tabaco -una reaccin alrgica o inusual a los estrgenos, otras hormonas, otros medicamentos, alimentos, colorantes o conservantes -si est embarazada o buscando quedar embarazada -si est amamantando a un beb Cmo debo [employer]? Tome este medicamento por va oral. Puede tomar este medicamento con alimentos para reducir la nusea. Siga las instrucciones de la etiqueta del [place]. Tome Coca-Cola siempre a la misma Buyer, retail y en el orden indicado por el paquete. No tome su medicamento con una frecuencia mayor a la indicada. Hable con su pediatra para informarse acerca del uso de este medicamento en nios. Puede requerir atencin especial. Usted recibir un prospecto para el paciente para este producto con cada receta y relleno. Asegrese de leer este folleto cada vez cuidadosamente. Este folleto puede cambiar con frecuencia. Sobredosis: Pngase en contacto inmediatamente con un centro toxicolgico o una sala de urgencia si usted cree que haya tomado demasiado medicamento. ATENCIN: [employer] es solo para usted. No comparta este medicamento con nadie. Qu sucede si me olvido de una dosis? Si olvida una dosis, tmela lo antes posible. Si es casi la hora de la prxima dosis, tome slo esa dosis. No tome dosis adicionales o dobles. Qu puede interactuar con este medicamento? No tome esta medicina con ninguno de los siguientes medicamentos: -inhibidores de Hydrologist, tales como aminoglutetimida, anastrozol, exemestno, letrozol, testolactona Esta medicina tambin puede interactuar con los siguientes medicamentos: -carbamazepina -ciertos antibiticos utilizados para tratar infecciones -ciertos barbitricos o benzodiacepinas para inducir el sueo o para el tratamiento de convulsiones -jugo de toronja -medicamentos para infecciones micticas, tales como itraconazol y  Arboriculturist -raloxifeno o tamoxifeno -rifabutina, rifampicina o rifapentina -ritonavir -hierba de [place] ser que esta lista no menciona todas las  posibles interacciones. Informe a su profesional de KB Home	[place] de [employer] productos a base de hierbas, medicamentos de [place] o suplementos nutritivos que est tomando. Si usted fuma, consume bebidas alcohlicas o si utiliza drogas ilegales, indqueselo tambin a su profesional de KB Home	[place]. Algunas sustancias pueden interactuar con su medicamento. A qu debo estar atento al usar Coca-Cola? Visite a su mdico o a su profesional de la salud para chequear su evolucin peridicamente. Debe hacerse exmenes de [place] y exmenes de las mamas y la pelvis en forma regular mientras est tomando este medicamento. Tambin debe discutir con su profesional de la salud la necesidad de Sport and exercise psychologist peridicamente y seguir las pautas que este profesional establezca para estas pruebas. Este medicamento puede hacer que su cuerpo retenga lquido, lo que puede provocar que se le hinchen los dedos, manos o tobillos. Su presin sangunea Regulatory affairs officer. Comunquese con su mdico o con su profesional de la salud si siente que est reteniendo lquido. Si tiene algn motivo para pensar que est embarazada, deje de tomar este medicamento de inmediato y comunquese con su mdico o con su profesional de Technical sales engineer. El fumar tabaco mientras toma este medicamento aumenta el riesgo de formacin de cogulos o de sufrir un derrame cerebral, especialmente si tiene ms de 35 aos de edad. Se le recomienda enfticamente que no fume. Si [place] lentes de contacto y observa cambios en la visin, o si los lentes comienzan a resultarle incmodos, consulte con su mdico de los ojos o con su profesional de KB Home	[place]. Este medicamento incrementa el riesgo de desarrollar una enfermedad (hiperplasia del endometrio) que puede derivar en cncer del revestimiento del  tero. Al tomar otra hormona llamada progestina juntamente con este medicamento, se disminuye el riesgo de desarrollar esta enfermedad. Por lo tanto, si no le han extirpado el tero (por medio de una histerectoma), su mdico puede recetarle progestina para acompaar el tratamiento con estrgeno. Sin embargo, debe saber que puede correr [employer] adicionales para la salud al tomar estrgenos con progestina. Debe consultar sobre el uso de estrgenos y progestinas a su profesional de la salud a fin de Aeronautical engineer beneficios y Gaffer que puede [place]. Si va a someterse a una operacin, tal vez deba dejar de tomar este medicamento. Consulte con su profesional de la salud antes de Editor, commissioning operacin. Qu efectos secundarios puedo tener al [employer] este medicamento? Efectos secundarios que debe informar a su mdico o a Barrister's clerk de la salud tan pronto como sea posible: -Chief of Staff como erupcin cutnea, picazn o urticarias, hinchazn de la cara, labios o lengua -secreciones o cambios en el tejido de las mamas -cambios en la visin -dolor en el pecho -confusin, dificultad para hablar o entender -orina de color amarillo oscuro -sensacin general de estar enfermo o sntomas gripales -heces claras -nuseas o vmito -dolor, hinchazn, sensacin clida en las piernas -dolor en la parte superior del abdomen -dolores de cabeza severos -falta de aliento repentina -debilidad o entumecimiento repentino de la cara, brazos o piernas -problemas para caminar, mareos, prdida de la coordinacin o equilibrio -sangrado vaginal inusual -color amarillento de los ojos o la piel Efectos secundarios que, por lo general, no requieren Geophysical data processor (debe informarlos a su mdico o a Barrister's clerk de la salud si persisten o si son molestos): -cada del cabello -aumento de apetito o sed -aumento de descargas de orina -sntomas de una infeccin vaginal, como picazn, irritacin o flujo  inusual -cansancio o debilidad inusual Puede ser que  esta lista no menciona todos los posibles efectos secundarios. Comunquese a su mdico por asesoramiento mdico [employer]. Usted puede informar los efectos secundarios a la FDA por telfono al 1-800-FDA-1088. Dnde debo guardar mi medicina? Mantngala fuera del alcance de los nios. Gurdela a [employer], entre 20 y 15 grados C (25 y 60 grados F). Mantenga el envase bien cerrado. Protjala de la luz. Deseche todo el medicamento que no haya utilizado, despus de la fecha de vencimiento. ATENCIN: Este folleto es un resumen. Puede ser que no cubra toda la posible informacin. Si usted tiene preguntas acerca de esta medicina, consulte con su mdico, su farmacutico o su profesional de Technical sales engineer.    2016, Elsevier/Gold Standard. (2014-11-23 00:00:00) La menopausia (Menopause) La menopausia es el perodo normal de la vida en el cual los perodos menstruales cesan por completo. Se completa cuando no se tienen 12 perodos menstruales consecutivos. Ocurre generalmente en mujeres entre los 40 y los 37 aos. Muy rara vez una mujer tiene la menopausia antes de los 40 [place]. En la menopausia, los ovarios dejan de producir las hormonas femeninas estrgeno y Immunologist. Esto puede causar sntomas indeseables y Technical brewer a Technical sales engineer. A veces los sntomas aparecen entre 4 y 5 aos antes de que comience la menopausia. No existe una relacin [employer] y:  [place] anticonceptivos orales.  La cantidad de hijos que tuvo.  La raza.  La edad en que comenzaron los perodos menstruales (menarca). Las mujeres que fuman mucho y las que son muy delgadas pueden desarrollar la menopausia a una edad ms temprana. CAUSAS  Los ovarios dejan de producir las hormonas femeninas estrgeno y Immunologist.  Otras causas son:  [place] en la que se extirpan ambos ovarios.  Los ovarios que dejan de funcionar sin un motivo  conocido.  Tumores de la glndula pituitaria en el cerebro.  Enfermedades que [employer] ovarios y la produccin de hormonas.  Radioterapia en el abdomen o la pelvis.  Quimioterapia que afecte a los ovarios. SNTOMAS   Acaloramiento.  Sudoracin nocturna.  Disminucin del deseo sexual.  Sequedad vaginal y adelgazamiento de la vagina, lo que causa relaciones sexuales dolorosas.  Sequedad de la piel y aparicin de Glass blower/designer.  Dolores de [place].  Cansancio.  Irritabilidad.  Problemas de memoria.  Aumento de [place].  Infeccin de la vejiga.  Aparicin de vello en el rostro y [place].  Infertilidad. Los sntomas ms graves pueden incluir:  Prdida de masa sea osteoporosis) que favorece la rotura de huesos(fracturas).  Depresin.  Endurecimiento y Public librarian de las arterias (aterosclerosis) lo que puede causar infarto e ictus. DIAGNSTICO   El perodo menstrual no aparece por 12 meses seguidos.  Examen fsico.  Estudios hormonales de Herbalist. TRATAMIENTO  Hay muchas opciones de tratamiento y casi tantas dudas como opciones existen. La decisin de tratar o no los cambios que trae la menopausia es una decisin que realiza el profesional de acuerdo con cada Advertising copywriter. El Management consultant las opciones de tratamiento con usted. Juntos podrn decidir qu tratamiento es el mejor para usted. Las opciones de tratamiento pueden incluir:   Terapia hormonal (estrgenos y Immunologist).  Medicamentos no hormonales.  Tratamiento de los sntomas individuales con medicamentos (por ejemplo antidepresivos para la depresin).  Algunos medicamentos herbales pueden ayudar en sntomas especficos.  Psicoterapia con un psiclogo o psiquiatra.  Terapia grupal.  Cambios en el estilo de vida, como:  Consumir una dieta saludable.  Actividad fsica regular.  Limitar el  consumo de cafena y alcohol.  Control del estrs y Public affairs consultant.  No realizar  tratamiento. [place] todos los medicamentos como el mdico le indique.  Descanse y duerma lo suficiente.  Haga ejercicios regularmente.  Consuma una dieta que contenga calcio (bueno para los [place]) y productos derivados de la soja (actan como estrgenos).  Evite las bebidas alcohlicas.  No fume.  Si tiene sofocones, vstase en capas.  Tome suplementos de calcio y vitamina D para fortalecer los [place].  Puede utilizar lubricantes y humectantes de venta libre para la sequedad vaginal.  En algunos casos la terapia de grupo podr ayudarla.  La acupuntura puede ser de ayuda en ciertos casos. SOLICITE ATENCIN MDICA SI:   No est segura de estar en la menopausia.  Tiene sntomas de [place] y [place] asesoramiento y Clinical research associate.  Tiene 22 aos y an tiene Chartered certified accountant.  Tiene dolor durante las [employer].  La menopausia se ha completado (no ha tenido el perodo menstrual durante 12 meses) y tiene un sangrado vaginal.  Necesita ser derivada a un especialista (gineclogo, psiquiatra, o psiclogo) para Arts administrator. SOLICITE ATENCIN MDICA DE INMEDIATO SI:   Siente una depresin intensa e incontrolable.  Tiene una hemorragia vaginal abundante.  Siente y cree que se ha fracturado un hueso.  Siente dolor al [employer].  Siente dolor en el pecho o en la pierna.  Tiene latidos cardacos rpidos (palpitaciones).  Siente dolor de cabeza intenso.  Tiene problemas de visin.  Siente un bulto en la mama.  Siente un dolor abdominal intenso o indigestin.   Esta informacin no tiene Marine scientist el consejo del mdico. Asegrese de hacerle al mdico cualquier pregunta que tenga.   Document Released: 11/12/2006 Document Revised: 06/03/2013 Elsevier Interactive Patient Education [employer].

## 2015-11-07 NOTE — Progress Notes (Signed)
   Patient is a 45 year old who presented to the office today as part of her workup for her chronic amenorrhea. Patient has been complaining of hot flashes daily as well as decreased libido on and off. She is being followed by her PCP who is treating her for hypertension and type 2 diabetes. Patient had stated she had not had a menstrual cycle since 2014. She denied any nipple discharge or any visual disturbances or any unusual headache.Patient has informed me that her mother and grandmother into the menopause at an early stage other lives.  On October 2016 we had checked her [facility] and was found to be elevated at 47.6 it was repeated to 2 weeks later and it was elevated at 50.7 indicating that it was in the menopausal range. She also had a negative pregnancy test and a normal TSH and prolactin.  We will through a detail discussion Spanish on premature ovarian failure as well as the risk benefits and pros and cons of hormone replacement therapy. We discussed women's health initiative study. We also recommend that she use condoms for contraception in the event that she would spontaneously ovulate. She is going to be started on Estrace 0.5 mg 1 by mouth daily with the addition of Provera 10 mg for 10 days of the month. Patient also to schedule her mammogram. Her recent Pap smear was normal. Literature information on hormone replacement therapy and menopause was provided in [place].

## 2016-01-31 ENCOUNTER — Encounter: Payer: Self-pay | Admitting: Internal Medicine

## 2016-03-28 ENCOUNTER — Emergency Department (HOSPITAL_BASED_OUTPATIENT_CLINIC_OR_DEPARTMENT_OTHER): Payer: Self-pay

## 2016-03-28 ENCOUNTER — Encounter (HOSPITAL_BASED_OUTPATIENT_CLINIC_OR_DEPARTMENT_OTHER): Payer: Self-pay | Admitting: *Deleted

## 2016-03-28 ENCOUNTER — Emergency Department (HOSPITAL_BASED_OUTPATIENT_CLINIC_OR_DEPARTMENT_OTHER)
Admission: EM | Admit: 2016-03-28 | Discharge: 2016-03-28 | Disposition: A | Discharge status: Home / Self Care | Payer: Self-pay | Attending: Emergency Medicine | Admitting: Emergency Medicine

## 2016-03-28 DIAGNOSIS — W01198A Fall on same level from slipping, tripping and stumbling with subsequent striking against other object, initial encounter: Secondary | ICD-10-CM | POA: Insufficient documentation

## 2016-03-28 DIAGNOSIS — Y929 Unspecified place or not applicable: Secondary | ICD-10-CM | POA: Insufficient documentation

## 2016-03-28 DIAGNOSIS — Y93E5 Activity, floor mopping and cleaning: Secondary | ICD-10-CM | POA: Insufficient documentation

## 2016-03-28 DIAGNOSIS — W19XXXA Unspecified fall, initial encounter: Secondary | ICD-10-CM

## 2016-03-28 DIAGNOSIS — M545 Low back pain, unspecified: Secondary | ICD-10-CM

## 2016-03-28 DIAGNOSIS — S060X0A Concussion without loss of consciousness, initial encounter: Secondary | ICD-10-CM | POA: Insufficient documentation

## 2016-03-28 DIAGNOSIS — Z7984 Long term (current) use of oral hypoglycemic drugs: Secondary | ICD-10-CM | POA: Insufficient documentation

## 2016-03-28 DIAGNOSIS — E119 Type 2 diabetes mellitus without complications: Secondary | ICD-10-CM | POA: Insufficient documentation

## 2016-03-28 DIAGNOSIS — Y999 Unspecified external cause status: Secondary | ICD-10-CM | POA: Insufficient documentation

## 2016-03-28 DIAGNOSIS — Z79899 Other long term (current) drug therapy: Secondary | ICD-10-CM | POA: Insufficient documentation

## 2016-03-28 DIAGNOSIS — I10 Essential (primary) hypertension: Secondary | ICD-10-CM | POA: Insufficient documentation

## 2016-03-28 HISTORY — DX: Type 2 diabetes mellitus without complications: E11.9

## 2016-03-28 LAB — PREGNANCY, URINE: Preg Test, Ur: NEGATIVE

## 2016-03-28 MED ORDER — TRAMADOL HCL 50 MG PO TABS: 50.0000 mg | ORAL_TABLET | Freq: Four times a day (QID) | ORAL | Status: DC | PRN

## 2016-03-28 MED ORDER — KETOROLAC TROMETHAMINE 15 MG/ML IJ SOLN
15.0000 mg | Freq: Once | INTRAMUSCULAR | Status: AC
  Administered 2016-03-28: 15 mg via INTRAVENOUS
  Filled 2016-03-28: qty 1

## 2016-03-28 MED ORDER — DIPHENHYDRAMINE HCL 25 MG PO CAPS
25.0000 mg | ORAL_CAPSULE | Freq: Once | ORAL | Status: AC
  Administered 2016-03-28: 25 mg via ORAL
  Filled 2016-03-28: qty 1

## 2016-03-28 MED ORDER — METOCLOPRAMIDE HCL 10 MG PO TABS
10.0000 mg | ORAL_TABLET | Freq: Once | ORAL | Status: AC
  Administered 2016-03-28: 10 mg via ORAL
  Filled 2016-03-28: qty 1

## 2016-03-28 MED ORDER — OXYCODONE HCL 5 MG PO TABS
5.0000 mg | ORAL_TABLET | Freq: Once | ORAL | Status: AC
  Administered 2016-03-28: 5 mg via ORAL
  Filled 2016-03-28: qty 1

## 2016-03-28 MED FILL — traMADol HCL 50 MG TABS: 50 | 2 days supply | Qty: 10 | Fill #0

## 2016-03-28 NOTE — Discharge Instructions (Signed)
Please closely monitor for any new or worsening signs or symptoms. If any present please return medially for further evaluation and management.  Back Pain, Adult Back pain is very common in adults.The cause of back pain is rarely dangerous and the pain often gets better over time.The cause of your back pain may not be known. Some common causes of back pain include:  Strain of the muscles or ligaments supporting the spine.  Wear and tear (degeneration) of the spinal disks.  Arthritis.  Direct injury to the back. For many people, back pain may return. Since back pain is rarely dangerous, most people can learn to manage this condition on their own. HOME CARE INSTRUCTIONS Watch your back pain for any changes. The following actions may help to lessen any discomfort you are feeling:  Remain active. It is stressful on your back to sit or stand in one place for long periods of time. Do not sit, drive, or stand in one place for more than 30 minutes at a time. Take short walks on even surfaces as soon as you are able.Try to increase the length of time you walk each day.  Exercise regularly as directed by your health care provider. Exercise helps your back heal faster. It also helps avoid future injury by keeping your muscles strong and flexible.  Do not stay in bed.Resting more than 1-2 days can delay your recovery.  Pay attention to your body when you bend and lift. The most comfortable positions are those that put less stress on your recovering back. Always use proper lifting techniques, including:  Bending your knees.  Keeping the load close to your body.  Avoiding twisting.  Find a comfortable position to sleep. Use a firm mattress and lie on your side with your knees slightly bent. If you lie on your back, put a pillow under your knees.  Avoid feeling anxious or stressed.Stress increases muscle tension and can worsen back pain.It is important to recognize when you are anxious or  stressed and learn ways to manage it, such as with exercise.  Take medicines only as directed by your health care provider. Over-the-counter medicines to reduce pain and inflammation are often the most helpful.Your health care provider may prescribe muscle relaxant drugs.These medicines help dull your pain so you can more quickly return to your normal activities and healthy exercise.  Apply ice to the injured area:  Put ice in a plastic bag.  Place a towel between your skin and the bag.  Leave the ice on for 20 minutes, 2-3 times a day for the first 2-3 days. After that, ice and heat may be alternated to reduce pain and spasms.  Maintain a healthy weight. Excess weight puts extra stress on your back and makes it difficult to maintain good posture. SEEK MEDICAL CARE IF:  You have pain that is not relieved with rest or medicine.  You have increasing pain going down into the legs or buttocks.  You have pain that does not improve in one week.  You have night pain.  You lose weight.  You have a fever or chills. SEEK IMMEDIATE MEDICAL CARE IF:   You develop new bowel or bladder control problems.  You have unusual weakness or numbness in your arms or legs.  You develop nausea or vomiting.  You develop abdominal pain.  You feel faint.   This information is not intended to replace advice given to you by your health care provider. Make sure you discuss any questions you  have with your health care provider.   Document Released: 10/01/2005 Document Revised: 10/22/2014 Document Reviewed: 02/02/2014 Elsevier Interactive Patient Education [employer].

## 2016-03-28 NOTE — ED Notes (Signed)
Patient transported to X-ray 

## 2016-03-28 NOTE — ED Notes (Addendum)
Pt slipped and fell at work. Hit back first then head. ?LOC. Pt does not recall events around fall. Pt oriented to self and location but states "no" when asked what today is

## 2016-03-28 NOTE — ED Notes (Signed)
Pt reports trip and fall on wet floor at work, hitting her posterior head on concrete floor. Unsure of loc. Pt is tearful, reports pain to posterior scalp, no abrasions, lacerations noted.

## 2016-03-28 NOTE — ED Provider Notes (Signed)
CSN: [id number]     Arrival date & time 03/28/16  1021 History   First MD Initiated Contact with Patient 03/28/16 1048     Chief Complaint  Patient presents with  . Head Injury   HPI   45 year old female presents status post fall. Patient Spanish-speaking, request daughter as interpreter. Daughter notes that she was cleaning this morning when she slipped, fell back landing and striking the posterior aspect of her head and back. Bystanders were there and witnessed this, they report no loss of consciousness. Presently patient reports that she's having posterior headache, neck pain, back pain. She reports chronic history of neck and back pain, no history of head pain. She reports that she's having difficulty recalling events surrounding the episode. She denies any neurological deficits, chest pain, abdominal pain, nausea or vomiting. Not taking any blood thinners. No bleeding noted. No rhinorrhea, or bruising.     Past Medical History  Diagnosis Date  . Hypertension   . Missed ab   . Fibromyalgia   . Diabetes mellitus without complication [facility])    Past Surgical History  Procedure Laterality Date  . Cesarean section    . Cholecystectomy     Family History  Problem Relation Age of Onset  . Diabetes Mother   . Hypertension Mother   . Heart disease Mother    Social History  Substance Use Topics  . Smoking status: Never Smoker   . Smokeless tobacco: None  . Alcohol Use: No   OB History    Gravida Para Term Preterm AB TAB SAB Ectopic Multiple Living   4 3   1  1   3      Review of Systems  All other systems reviewed and are negative.   Allergies  Review of patient's allergies indicates no known allergies.  Home Medications   Prior to Admission medications   Medication Sig Start Date End Date Taking? Authorizing Provider  cyclobenzaprine (FLEXERIL) 10 MG tablet Take 10 mg by mouth 3 (three) times daily as needed for muscle spasms.   Yes Historical Provider, MD   losartan-hydrochlorothiazide (HYZAAR) 100-25 MG tablet Take 1 tablet by mouth daily.   Yes Historical Provider, MD  metFORMIN (GLUCOPHAGE) 500 MG tablet Take by mouth 2 (two) times daily with a meal.   Yes Historical Provider, MD  omeprazole (PRILOSEC) 20 MG capsule Take 20 mg by mouth daily.   Yes Historical Provider, MD  estradiol (ESTRACE) 0.5 MG tablet Take 1 tablet (0.5 mg total) by mouth daily. 11/07/15   Terrance Mass, MD  ibuprofen (ADVIL,MOTRIN) 600 MG tablet Take 1 tablet (600 mg total) by mouth every 8 (eight) hours as needed for pain. 03/20/13   Huel Cote, NP  medroxyPROGESTERone (PROVERA) 10 MG tablet Take one tablet first ten days of the ,omth 11/07/15   Terrance Mass, MD  traMADol (ULTRAM) 50 MG tablet Take 1 tablet (50 mg total) by mouth every 6 (six) hours as needed. 03/28/16   Baine Decesare, PA-C   BP 162/94 mmHg  Pulse 75  Temp(Src) 97 F (36.1 C) (Oral)  Resp 20  Ht 5\' 6"  (1.676 m)  Wt 90.719 kg  BMI 32.30 kg/m2  SpO2 99% Physical Exam  Constitutional: She is oriented to person, place, and time. She appears well-developed and well-nourished.  HENT:  Head: Normocephalic and atraumatic.  Head is atraumatic, tenderness to palpation of the posterior lower skull, cervical spine.   No signs of basilar skull fracture  No rhinorrhea  Clear  external auditory canals, TMs normal  Ocular movements intact, pupils equal round and reactive to light  Eyes: Conjunctivae are normal. Pupils are equal, round, and reactive to light. Right eye exhibits no discharge. Left eye exhibits no discharge. No scleral icterus.  Neck: Normal range of motion. No JVD present. No tracheal deviation present.  Cardiovascular: Normal rate, regular rhythm and normal heart sounds.  Exam reveals no gallop and no friction rub.   No murmur heard. Pulmonary/Chest: Effort normal and breath sounds normal. No stridor. No respiratory distress. She has no wheezes. She has no rales. She exhibits no  tenderness.  Musculoskeletal: Normal range of motion. She exhibits tenderness. She exhibits no edema.  Patient has tenderness diffusely to the thoracic and lumbar regions, no focal tenderness. Straight leg negative, upper and lower strength 5 out of 5, sensation grossly intact.  Neurological: She is alert and oriented to person, place, and time. She has normal strength. No cranial nerve deficit or sensory deficit. Coordination normal. GCS eye subscore is 4. GCS verbal subscore is 5. GCS motor subscore is 6.  Skin: Skin is warm and dry. No rash noted. No erythema. No pallor.  Psychiatric: She has a normal mood and affect. Her behavior is normal. Judgment and thought content normal.  Nursing note and vitals reviewed.   ED Course  Procedures (including critical care time) Labs Review Labs Reviewed  PREGNANCY, URINE    Imaging Review Dg Thoracic Spine 2 View  03/28/2016  CLINICAL DATA:  Acute mid back pain after fall today. Initial encounter. EXAM: THORACIC SPINE 2 VIEWS COMPARISON:  Radiographs of August 10, 2010. FINDINGS: No fracture or spondylolisthesis is noted. Mild osteophyte formation is seen involving the lower thoracic spine. Disc spaces appear to be well maintained. IMPRESSION: Mild degenerative changes as described above. No acute abnormality seen in the thoracic spine. Electronically Signed   By: Marijo Conception, M.D.   On: 03/28/2016 12:53   Dg Lumbar Spine Complete  03/28/2016  CLINICAL DATA:  Left-sided mid to low back pain after falling today. Initial encounter. EXAM: LUMBAR SPINE - COMPLETE 4+ VIEW COMPARISON:  Abdominal pelvic CT 08/14/2009. Lumbar spine radiographs 12/18/2008. FINDINGS: There are 5 lumbar type vertebral bodies. The alignment is stable with a mild convex right scoliosis. No evidence of acute fracture or pars defect. There is mild intervertebral spurring throughout the lumbar spine, greatest at L3-4. The disc spaces are relatively preserved. Cholecystectomy clips  are noted. IMPRESSION: No acute osseous findings.  Stable mild intervertebral spurring. Electronically Signed   By: Richardean Sale M.D.   On: 03/28/2016 12:55   Ct Head Wo Contrast  03/28/2016  CLINICAL DATA:  Fall with head injury. Posterior head and neck pain. Initial encounter. EXAM: CT HEAD WITHOUT CONTRAST CT CERVICAL SPINE WITHOUT CONTRAST TECHNIQUE: Multidetector CT imaging of the head and cervical spine was performed following the standard protocol without intravenous contrast. Multiplanar CT image reconstructions of the cervical spine were also generated. COMPARISON:  Head CT 07/13/2009. Cervical spine radiography 11/10/2009 FINDINGS: CT HEAD FINDINGS Skull and Sinuses:Negative for fracture or hemo sinus. Right parietal calvarial flattening which is likely from positional plagiocephaly. Visualized orbits: Negative. Brain: No evidence of acute infarction, hemorrhage, hydrocephalus, or mass lesion/mass effect. Presumed dilated perivascular space below the left putamen. CT CERVICAL SPINE FINDINGS Negative for acute fracture or subluxation. No prevertebral edema. No gross cervical canal hematoma. IMPRESSION: No evidence of intracranial or cervical spine injury. Electronically Signed   By: Monte Fantasia M.D.   On:  03/28/2016 12:48   Ct Cervical Spine Wo Contrast  03/28/2016  CLINICAL DATA:  Fall with head injury. Posterior head and neck pain. Initial encounter. EXAM: CT HEAD WITHOUT CONTRAST CT CERVICAL SPINE WITHOUT CONTRAST TECHNIQUE: Multidetector CT imaging of the head and cervical spine was performed following the standard protocol without intravenous contrast. Multiplanar CT image reconstructions of the cervical spine were also generated. COMPARISON:  Head CT 07/13/2009. Cervical spine radiography 11/10/2009 FINDINGS: CT HEAD FINDINGS Skull and Sinuses:Negative for fracture or hemo sinus. Right parietal calvarial flattening which is likely from positional plagiocephaly. Visualized orbits:  Negative. Brain: No evidence of acute infarction, hemorrhage, hydrocephalus, or mass lesion/mass effect. Presumed dilated perivascular space below the left putamen. CT CERVICAL SPINE FINDINGS Negative for acute fracture or subluxation. No prevertebral edema. No gross cervical canal hematoma. IMPRESSION: No evidence of intracranial or cervical spine injury. Electronically Signed   By: Monte Fantasia M.D.   On: 03/28/2016 12:48   I have personally reviewed and evaluated these images and lab results as part of my medical decision-making.   EKG Interpretation None      MDM   Final diagnoses:  Fall, initial encounter  Concussion, without loss of consciousness, initial encounter  Bilateral low back pain without sciatica    Labs:Pregnancy urine negative  Imaging: DG thoracic, DG lumbar, CT head, CT cervical  Consults:  Therapeutics:  Discharge Meds:   Assessment/Plan:45 year old female status post head injury. She has no signs of trauma to her head neck or back. She is tender to palpation, she does have some amnesia. She appears to be no acute distress, is laughing throughout my exam. CT head neck negative, DG films of thoracic and lumbar spine negative. Patient has no neurological deficits, does not take any blood thinners. Likely muscular pain from the fall. I see no acute findings that would necessitate further evaluation or management here in the ED. Patient lives with her husband and her daughter will be present with her at the home. Both patient and the family are counseled on strict return precautions, they verbalized understanding and agreement today's plan. Patient encouraged follow-up with orthopedics if her back continues to hurt, primary care if she continues to have headaches.        Okey Regal, PA-C 03/28/16 1657  Harvel Quale, MD 04/04/16 [phone]

## 2016-10-15 DIAGNOSIS — E288 Other ovarian dysfunction: Secondary | ICD-10-CM

## 2016-10-15 DIAGNOSIS — E2839 Other primary ovarian failure: Secondary | ICD-10-CM | POA: Insufficient documentation

## 2016-11-09 ENCOUNTER — Ambulatory Visit: Payer: Self-pay

## 2016-11-13 ENCOUNTER — Encounter: Payer: Self-pay | Admitting: Physician Assistant

## 2016-11-20 ENCOUNTER — Ambulatory Visit: Payer: Self-pay | Admitting: Physician Assistant

## 2016-11-23 ENCOUNTER — Encounter: Payer: Self-pay | Admitting: Gynecology

## 2016-11-30 ENCOUNTER — Encounter: Payer: Self-pay | Admitting: Gynecology

## 2016-11-30 ENCOUNTER — Ambulatory Visit (INDEPENDENT_AMBULATORY_CARE_PROVIDER_SITE_OTHER): Payer: Self-pay | Admitting: Gynecology

## 2016-11-30 VITALS — BP 124/80 | Ht 64.0 in | Wt 201.0 lb

## 2016-11-30 DIAGNOSIS — E28319 Asymptomatic premature menopause: Secondary | ICD-10-CM | POA: Insufficient documentation

## 2016-11-30 DIAGNOSIS — Z01411 Encounter for gynecological examination (general) (routine) with abnormal findings: Secondary | ICD-10-CM

## 2016-11-30 DIAGNOSIS — N898 Other specified noninflammatory disorders of vagina: Secondary | ICD-10-CM

## 2016-11-30 DIAGNOSIS — Z780 Asymptomatic menopausal state: Secondary | ICD-10-CM

## 2016-11-30 DIAGNOSIS — M6289 Other specified disorders of muscle: Secondary | ICD-10-CM

## 2016-11-30 DIAGNOSIS — L292 Pruritus vulvae: Secondary | ICD-10-CM

## 2016-11-30 LAB — WET PREP FOR TRICH, YEAST, CLUE
CLUE CELLS WET PREP: NONE SEEN
Trich, Wet Prep: NONE SEEN
Yeast Wet Prep HPF POC: NONE SEEN

## 2016-11-30 LAB — URINALYSIS W MICROSCOPIC + REFLEX CULTURE
BILIRUBIN URINE: NEGATIVE
CASTS: NONE SEEN [LPF]
Crystals: NONE SEEN [HPF]
Glucose, UA: NEGATIVE
Hgb urine dipstick: NEGATIVE
KETONES UR: NEGATIVE
NITRITE: NEGATIVE
PH: 7 (ref 5.0–8.0)
Protein, ur: NEGATIVE
RBC / HPF: NONE SEEN RBC/HPF (ref <=2)
SPECIFIC GRAVITY, URINE: 1.02 (ref 1.001–1.035)
Yeast: NONE SEEN [HPF]

## 2016-11-30 MED ORDER — METRONIDAZOLE 500 MG PO TABS
500.0000 mg | ORAL_TABLET | Freq: Two times a day (BID) | ORAL | 0 refills | Status: DC
Start: 2016-11-30 — End: 2017-05-10

## 2016-11-30 NOTE — Patient Instructions (Signed)
Deficiencia de vitaminaD (Vitamin D Deficiency) La deficiencia de vitaminaD ocurre cuando el organismo no tiene suficiente vitaminaD. La vitaminaD es importante para el organismo por muchos motivos:  Ayuda al organismo a absorber dos minerales llamados calcio y fsforo.  Desempea un papel en la salud de los huesos.  Puede ayudar a prevenir algunas enfermedades, como la diabetes y la esclerosis mltiple.  Desempea un papel en la funcin muscular, incluida la actividad cardaca. Para obtener vitaminaD, puede hacer lo siguiente:  Consuma alimentos que contengan naturalmente vitaminaD.  Consuma o beba leche u otros productos lcteos con agregado de vitaminaD.  Tome un suplemento de vitaminaD o un suplemento multivitamnico que la contenga.  Expngase al sol. El organismo produce vitaminaD de forma natural cuando se expone la piel a la luz del sol. El organismo transforma la luz del sol en una forma de vitamina que puede utilizar. Si la deficiencia de vitaminaD es grave, puede causar una enfermedad que reblandece los huesos. En los adultos, se la conoce como osteomalacia. En los nios, recibe el nombre de raquitismo. CAUSAS La deficiencia de vitaminaD puede deberse a lo siguiente:  No comer suficientes alimentos que contengan vitamina D.  Exposicin insuficiente al sol.  Sufrir ciertas enfermedades del sistema digestivo que le dificultan al organismo la absorcin de vitaminaD. Estas enfermedades incluyen la enfermedad de Crohn, la pancreatitis crnica y la fibrosis qustica.  Someterse a una ciruga en la que se extirpa una parte del estmago o del intestino delgado.  Ser obeso.  Tener enfermedad renal crnica o enfermedad heptica. FACTORES DE RIESGO Es ms probable que esta afeccin se manifieste en:  Las personas de edad avanzada.  Las personas que no pasan mucho tiempo al aire libre.  Las personas que viven en un centro de atencin de larga estancia.  Las  personas que sufrieron fracturas seas.  Las personas que tienen huesos dbiles o delgados (osteoporosis).  Las personas que sufren una enfermedad o un trastorno que modifica la forma en que el organismo absorbe la vitaminaD.  Las personas de piel oscura.  Las personas que toman algunos medicamentos, como corticoides o determinados anticonvulsivos.  Las personas con sobrepeso u obesidad. SNTOMAS En los casos leves de deficiencia de vitaminaD, puede no haber sntomas. Si el trastorno es grave, los sntomas pueden incluir lo siguiente:  Dolor en los huesos.  Dolor muscular.  Caerse con frecuencia.  Huesos fracturados por una lesin menor. DIAGNSTICO Por lo general, este trastorno se diagnostica mediante un anlisis de sangre. TRATAMIENTO El tratamiento de este trastorno puede depender de la causa. Entre las otras opciones de tratamiento, se incluyen las siguientes:  Tomar suplementos de vitamina D.  Tomar un suplemento de calcio. El mdico le sugerir cul es la dosis ms adecuada para usted. INSTRUCCIONES PARA EL CUIDADO EN EL HOGAR  Tome los medicamentos y los suplementos solamente como se lo haya indicado el mdico.  Consuma alimentos que contengan vitaminaD, como por ejemplo:  Productos lcteos, cereales o jugos fortificados. El trmino fortificado significa que se ha aadido vitaminaD al alimento. Revise la etiqueta del paquete para estar seguro.  Pescados grasos, como el salmn o la trucha.  Huevos.  Ostras.  No utilice camas solares.  Mantenga un peso saludable. Baje de peso, si es necesario.  Concurra a todas las visitas de control como se lo haya indicado el mdico. Esto es importante. SOLICITE ATENCIN MDICA SI:  Los sntomas no desaparecen.  Tiene malestar estomacal (nuseas) o vomita.  Defeca menos que lo habitual o   le resulta difcil defecar (estreimiento). Esta informacin no tiene Marine scientist el consejo del mdico. Asegrese de  hacerle al mdico cualquier pregunta que tenga. Document Released: 12/24/2011 Document Revised: 06/22/2015 Document Reviewed: 02/16/2015 Elsevier Interactive Patient Education  2017 [place] menopausia (Menopause) La menopausia es el perodo normal de la vida en el cual los perodos menstruales cesan por completo. Se completa cuando no se tienen 12 perodos menstruales consecutivos. Ocurre generalmente en mujeres entre los 40 y los 2 aos. Muy rara vez una mujer tiene la menopausia antes de los 40 Ector. En la menopausia, los ovarios dejan de producir las hormonas femeninas estrgeno y Immunologist. Esto puede causar sntomas indeseables y Technical brewer a Technical sales engineer. A veces los sntomas aparecen entre 4 y 5 aos antes de que comience la menopausia. No existe una relacin [employer] y:  [place] anticonceptivos orales.  La cantidad de hijos que tuvo.  La raza.  La edad en que comenzaron los perodos menstruales (menarca). Las mujeres que fuman mucho y las que son muy delgadas pueden desarrollar la menopausia a una edad ms temprana. CAUSAS  Los ovarios dejan de producir las hormonas femeninas estrgeno y Immunologist.  Otras causas son:  [place] en la que se extirpan ambos ovarios.  Los ovarios que dejan de funcionar sin un motivo conocido.  Tumores de la glndula pituitaria en el cerebro.  Enfermedades que [employer] ovarios y la produccin de hormonas.  Radioterapia en el abdomen o la pelvis.  Quimioterapia que afecte a los ovarios. SNTOMAS  Acaloramiento.  Sudoracin nocturna.  Disminucin del deseo sexual.  Sequedad vaginal y adelgazamiento de la vagina, lo que causa relaciones sexuales dolorosas.  Sequedad de la piel y aparicin de Glass blower/designer.  Dolores de [place].  Cansancio.  Irritabilidad.  Problemas de memoria.  Aumento de [place].  Infeccin de la vejiga.  Aparicin de vello en el rostro y [place].  Infertilidad. Los sntomas ms graves  pueden incluir:  Prdida de masa sea osteoporosis) que favorece la rotura de huesos(fracturas).  Depresin.  Endurecimiento y Public librarian de las arterias (aterosclerosis) lo que puede causar infarto e ictus. DIAGNSTICO  El perodo menstrual no aparece por 12 meses seguidos.  Examen fsico.  Estudios hormonales de Herbalist. TRATAMIENTO Hay muchas opciones de tratamiento y casi tantas dudas como opciones existen. La decisin de tratar o no los cambios que trae la menopausia es una decisin que realiza el profesional de acuerdo con cada Advertising copywriter. El Management consultant las opciones de tratamiento con usted. Juntos podrn decidir qu tratamiento es el mejor para usted. Las opciones de tratamiento pueden incluir:  Terapia hormonal (estrgenos y Immunologist).  Medicamentos no hormonales.  Tratamiento de los sntomas individuales con medicamentos (por ejemplo antidepresivos para la depresin).  Algunos medicamentos herbales pueden ayudar en sntomas especficos.  Psicoterapia con un psiclogo o psiquiatra.  Terapia grupal.  Cambios en el estilo de vida, como:  Consumir una dieta saludable.  Actividad fsica regular.  Limitar el consumo de cafena y alcohol.  Control del estrs y Public affairs consultant.  No realizar tratamiento. [place] todos los medicamentos como el mdico le indique.  Descanse y duerma lo suficiente.  Haga ejercicios regularmente.  Consuma una dieta que contenga calcio (bueno para los [place]) y productos derivados de la soja (actan como estrgenos).  Evite las bebidas alcohlicas.  No fume.  Si tiene sofocones, vstase en capas.  Tome suplementos de calcio y vitamina D para fortalecer  los huesos.  Puede utilizar lubricantes y humectantes de venta libre para la sequedad vaginal.  En algunos casos la terapia de grupo podr ayudarla.  La acupuntura puede ser de ayuda en ciertos casos. SOLICITE  ATENCIN MDICA SI:  No est segura de estar en la menopausia.  Tiene sntomas de [place] y [place] asesoramiento y Clinical research associate.  Tiene 29 aos y an tiene Chartered certified accountant.  Tiene dolor durante las [employer].  La menopausia se ha completado (no ha tenido el perodo menstrual durante 12 meses) y tiene un sangrado vaginal.  Necesita ser derivada a un especialista (gineclogo, psiquiatra, o psiclogo) para Arts administrator. SOLICITE ATENCIN MDICA DE INMEDIATO SI:  Siente una depresin intensa e incontrolable.  Tiene una hemorragia vaginal abundante.  Siente y cree que se ha fracturado un hueso.  Siente dolor al [employer].  Siente dolor en el pecho o en la pierna.  Tiene latidos cardacos rpidos (palpitaciones).  Siente dolor de cabeza intenso.  Tiene problemas de visin.  Siente un bulto en la mama.  Siente un dolor abdominal intenso o indigestin. Esta informacin no tiene Marine scientist el consejo del mdico. Asegrese de hacerle al mdico cualquier pregunta que tenga. Document Released: 11/12/2006 Document Revised: 06/03/2013 Document Reviewed: 04/30/2013 Elsevier Interactive Patient Education  2017 [employer].

## 2016-11-30 NOTE — Addendum Note (Signed)
Addended by: Burnett Kanaris on: 11/30/2016 12:53 PM   Modules accepted: Orders

## 2016-11-30 NOTE — Progress Notes (Signed)
Amy [name] [birth year] [id number]   History:    46 y.o.  for annual gyn exam with history of premature ovarian failure. Patient with 2 [place] in 2015 2016 with values of 47.6 and 50.7 respectively. Patient having normal menstrual cycle. Very minimal if any vasomotor symptoms. She is no longer on any hormones. She was being followed at an indigent clinic where there treating her for type 2 diabetes for which she's taking metformin 500 mg twice a day. She brought all her recent lab work she also had mentioned that in the past she had been diagnosed with fibromyalgia but she does not recall ever having been tested for vitamin D. She also reports a normal colonoscopy back in 2010. Patient with no previous history of any abnormal Pap smears. Patient was having some slight vaginal discharge with minimal irritation. She is in a monogamous relationship. Patient was complaining of some urinary frequency but no dysuria, fever, chills, nausea, vomiting.  Past medical history,surgical history, family history and social history were all reviewed and documented in the EPIC chart.  Gynecologic History No LMP recorded. Contraception: none Last Pap: 2016. Results were: normal Last mammogram: Patient stated she had one in her native country several years ago she has not had one in the [place].. Results were: No studies and [place]  Obstetric History OB History  Gravida Para Term Preterm AB Living  4 3     1 3   SAB TAB Ectopic Multiple Live Births  1            # Outcome Date GA Lbr Len/2nd Weight Sex Delivery Anes PTL Lv  4 SAB           3 Para           2 Para           1 Para                ROS: A ROS was performed and pertinent positives and negatives are included in the history.  GENERAL: No fevers or chills. HEENT: No change in vision, no earache, sore throat or sinus congestion. NECK: No pain or stiffness. CARDIOVASCULAR: No chest pain or pressure. No palpitations. PULMONARY: No  shortness of breath, cough or wheeze. GASTROINTESTINAL: No abdominal pain, nausea, vomiting or diarrhea, melena or bright red blood per rectum. GENITOURINARY: No urinary frequency, urgency, hesitancy or dysuria. MUSCULOSKELETAL: No joint or muscle pain, no back pain, no recent trauma. DERMATOLOGIC: No rash, no itching, no lesions. ENDOCRINE: No polyuria, polydipsia, no heat or cold intolerance. No recent change in weight. HEMATOLOGICAL: No anemia or easy bruising or bleeding. NEUROLOGIC: No headache, seizures, numbness, tingling or weakness. PSYCHIATRIC: No depression, no loss of interest in normal activity or change in sleep pattern.     Exam: chaperone present  BP 124/80   Ht 5\' 4"  (1.626 m)   Wt 201 lb (91.2 kg)   BMI 34.50 kg/m   Body mass index is 34.5 kg/m.  General appearance : Well developed well nourished female. No acute distress HEENT: Eyes: no retinal hemorrhage or exudates,  Neck supple, trachea midline, no carotid bruits, no thyroidmegaly Lungs: Clear to auscultation, no rhonchi or wheezes, or rib retractions  Heart: Regular rate and rhythm, no murmurs or gallops Breast:Examined in sitting and supine position were symmetrical in appearance, no palpable masses or tenderness,  no skin retraction, no nipple inversion, no nipple discharge, no skin discoloration, no axillary or supraclavicular lymphadenopathy Abdomen: no  palpable masses or tenderness, no rebound or guarding Extremities: no edema or skin discoloration or tenderness  Pelvic:  Bartholin, Urethra, Skene Glands: Within normal limits             Vagina: No gross lesions or discharge  Cervix: No gross lesions or discharge  Uterus  anteverted, normal size, shape and consistency, non-tender and mobile  Adnexa  Without masses or tenderness  Anus and perineum  normal   Rectovaginal  normal sphincter tone without palpated masses or tenderness             Hemoccult not indicated   Wet prep: Moderate white blood cells  moderate bacteria no yeast  Urinalysis: Moderate bacteria, no red blood cells seen, 6-10 WBC.  Assessment/Plan:  46 y.o. female for annual exam with history of premature ovarian failure not on any hormone replacement therapy were discussed importance of calcium vitamin D and weightbearing exercises for osteoporosis prevention. Would recommend baseline bone density study at age 43. In the event of underlying BV based on findings on wet prep appointment to prescribe Flagyl 500 mg one by mouth twice a day for 7 days. Pap smear not indicated this year. We are going to check her vitamin D level today because of her muscle tiredness and fatigue which she attributes it to fibromyalgia that was diagnosed several years ago. I've given her the name of one of my internal medicine colleague's for follow-up on her type 2 diabetes since she's requesting to be seen by another provider instead of the indigent clinic which she has been attending. A requisition to schedule her mammogram was provided as well.     Terrance Mass MD, 11:29 AM 11/30/2016

## 2016-12-01 LAB — VITAMIN D 25 HYDROXY (VIT D DEFICIENCY, FRACTURES): VIT D 25 HYDROXY: 10 ng/mL — AB (ref 30–100)

## 2016-12-02 LAB — URINE CULTURE: ORGANISM ID, BACTERIA: NO GROWTH

## 2016-12-03 ENCOUNTER — Other Ambulatory Visit: Payer: Self-pay | Admitting: Gynecology

## 2016-12-03 ENCOUNTER — Encounter: Payer: Self-pay | Admitting: Gynecology

## 2016-12-03 DIAGNOSIS — E559 Vitamin D deficiency, unspecified: Secondary | ICD-10-CM

## 2016-12-03 MED ORDER — VITAMIN D (ERGOCALCIFEROL) 1.25 MG (50000 UNIT) PO CAPS: 50000.0000 [IU] | ORAL_CAPSULE | ORAL | 0 refills | Status: DC

## 2016-12-06 ENCOUNTER — Other Ambulatory Visit: Payer: Self-pay | Admitting: Gynecology

## 2016-12-06 DIAGNOSIS — Z1231 Encounter for screening mammogram for malignant neoplasm of breast: Secondary | ICD-10-CM

## 2016-12-19 ENCOUNTER — Ambulatory Visit
Admission: RE | Admit: 2016-12-19 | Discharge: 2016-12-19 | Disposition: A | Discharge status: Home / Self Care | Payer: No Typology Code available for payment source | Source: Ambulatory Visit | Attending: Gynecology | Admitting: Gynecology

## 2016-12-19 DIAGNOSIS — Z1231 Encounter for screening mammogram for malignant neoplasm of breast: Secondary | ICD-10-CM

## 2017-02-26 ENCOUNTER — Other Ambulatory Visit: Payer: Self-pay

## 2017-02-26 ENCOUNTER — Telehealth: Payer: Self-pay | Admitting: *Deleted

## 2017-02-26 DIAGNOSIS — E559 Vitamin D deficiency, unspecified: Secondary | ICD-10-CM

## 2017-02-26 MED ORDER — LOSARTAN POTASSIUM-HCTZ 100-25 MG PO TABS: 1.0000 | ORAL_TABLET | Freq: Every day | ORAL | 2 refills | Status: DC

## 2017-02-26 NOTE — Telephone Encounter (Signed)
Please call in prescription refill to all these medications to cover her until the end of July until she sees a new PCP

## 2017-02-26 NOTE — Telephone Encounter (Signed)
-----   Message from Sinclair Grooms sent at 02/26/2017 12:32 PM EDT ----- Regarding: rx JF told patient to make appointment with Dr Danise Mina so he can follow her for diabeties. She made appointment July 27 but has run out of meds. Wants to know if JF can call in losartan-hydrochlorothiazide (HYZAAR) 100-25 MG tablet to [facility] until she can see new doctor.

## 2017-02-26 NOTE — Telephone Encounter (Signed)
Rx sent 

## 2017-02-27 ENCOUNTER — Other Ambulatory Visit: Payer: Self-pay

## 2017-02-27 ENCOUNTER — Encounter: Payer: Self-pay | Admitting: Gynecology

## 2017-02-27 LAB — VITAMIN D 25 HYDROXY (VIT D DEFICIENCY, FRACTURES): VIT D 25 HYDROXY: 29 ng/mL — AB (ref 30–100)

## 2017-03-07 ENCOUNTER — Other Ambulatory Visit: Payer: Self-pay | Admitting: *Deleted

## 2017-03-07 DIAGNOSIS — E559 Vitamin D deficiency, unspecified: Secondary | ICD-10-CM

## 2017-03-07 MED ORDER — VITAMIN D (ERGOCALCIFEROL) 1.25 MG (50000 UNIT) PO CAPS: 50000.0000 [IU] | ORAL_CAPSULE | ORAL | 0 refills | Status: DC

## 2017-05-09 ENCOUNTER — Ambulatory Visit: Payer: Self-pay | Admitting: Family Medicine

## 2017-05-10 ENCOUNTER — Encounter: Payer: Self-pay | Admitting: Family Medicine

## 2017-05-10 ENCOUNTER — Ambulatory Visit (INDEPENDENT_AMBULATORY_CARE_PROVIDER_SITE_OTHER): Payer: Self-pay | Admitting: Family Medicine

## 2017-05-10 VITALS — BP 130/88 | HR 69 | Temp 98.2°F | Ht 64.0 in | Wt 194.0 lb

## 2017-05-10 DIAGNOSIS — E28319 Asymptomatic premature menopause: Secondary | ICD-10-CM

## 2017-05-10 DIAGNOSIS — K582 Mixed irritable bowel syndrome: Secondary | ICD-10-CM

## 2017-05-10 DIAGNOSIS — E288 Other ovarian dysfunction: Secondary | ICD-10-CM

## 2017-05-10 DIAGNOSIS — K219 Gastro-esophageal reflux disease without esophagitis: Secondary | ICD-10-CM

## 2017-05-10 DIAGNOSIS — E119 Type 2 diabetes mellitus without complications: Secondary | ICD-10-CM

## 2017-05-10 DIAGNOSIS — R1030 Lower abdominal pain, unspecified: Secondary | ICD-10-CM

## 2017-05-10 DIAGNOSIS — M797 Fibromyalgia: Secondary | ICD-10-CM

## 2017-05-10 DIAGNOSIS — E1165 Type 2 diabetes mellitus with hyperglycemia: Secondary | ICD-10-CM

## 2017-05-10 DIAGNOSIS — S0990XS Unspecified injury of head, sequela: Secondary | ICD-10-CM

## 2017-05-10 DIAGNOSIS — E559 Vitamin D deficiency, unspecified: Secondary | ICD-10-CM

## 2017-05-10 DIAGNOSIS — E2839 Other primary ovarian failure: Secondary | ICD-10-CM

## 2017-05-10 DIAGNOSIS — I10 Essential (primary) hypertension: Secondary | ICD-10-CM

## 2017-05-10 LAB — BASIC METABOLIC PANEL
BUN: 14 mg/dL (ref 6–23)
CHLORIDE: 104 meq/L (ref 96–112)
CO2: 27 meq/L (ref 19–32)
Calcium: 9.5 mg/dL (ref 8.4–10.5)
Creatinine, Ser: 0.72 mg/dL (ref 0.40–1.20)
GFR: 93.28 mL/min (ref >60.00)
Glucose, Bld: 97 mg/dL (ref 70–99)
POTASSIUM: 3.6 meq/L (ref 3.5–5.1)
Sodium: 139 mEq/L (ref 135–145)

## 2017-05-10 LAB — HEMOGLOBIN A1C: HEMOGLOBIN A1C: 6.3 % (ref 4.6–6.5)

## 2017-05-10 MED ORDER — LOSARTAN POTASSIUM-HCTZ 100-25 MG PO TABS: 1.0000 | ORAL_TABLET | Freq: Every day | ORAL | 1 refills | Status: DC

## 2017-05-10 MED ORDER — GLUCOSE BLOOD VI STRP: ORAL_STRIP | 12 refills | Status: DC

## 2017-05-10 MED ORDER — METFORMIN HCL 500 MG PO TABS: 500.0000 mg | ORAL_TABLET | Freq: Two times a day (BID) | ORAL | 1 refills | Status: DC

## 2017-05-10 MED ORDER — CYANOCOBALAMIN 500 MCG PO TABS: 500.0000 ug | ORAL_TABLET | Freq: Every day | ORAL | Status: DC

## 2017-05-10 NOTE — Patient Instructions (Addendum)
Firme records de [facility]. He rellenado medicamentos.  Laboratorios hoy.  Tome probiotico como [place] o [facility] - para ver si no mejora dolor de estomago.  Regresar en 3 meses para proxima cita.  Labs today

## 2017-05-10 NOTE — Progress Notes (Signed)
BP 130/88 (BP Location: Left Arm, Patient Position: Sitting, Cuff Size: Large)   Pulse 69   Temp 98.2 F (36.8 C) (Oral)   Ht 5\' 4"  (1.626 m)   Wt 194 lb (88 kg)   LMP 10/06/2014   SpO2 98%   BMI 33.30 kg/m    CC: new pt to establish care Subjective:    Patient ID: Amy [name], female    DOB: July 08, 1971, 46 y.o.   MRN: [id number]  HPI: Amy [name] is a 46 y.o. female presenting on 05/10/2017 for Establish Care (GYN sent her here due to HTN and Diabetes.)   Prior seen at [facility]). Self pay patient. She started working at [employer]. Work schedule 4am-12pm.   GYN - sees Dr Toney Rakes - he may be retiring. H/o premature ovarian failure. Not on HRT.   HTN - compliant with losartan hctz 100/25mg  daily. Finds pizza causes hypertension. She has BP cuff at home.   DM - h/o gestational diabetes. She does check sugars regularly TID with Relion brand glucometer. Compliant with metformin 500mg  bid. Denies hyper or hypoglycemic sxs. Last eye exam: pending 06/2017. Unsure if she's received pneumovax - will await records.  Lab Results  Component Value Date   HGBA1C 6.3 05/10/2017  she drinks green smoothie she makes at home every morning.   Vit D deficiency - taking 50k replacement.   H/o fibromyalgia and IBS  Ongoing LLQ discomfort over last 6 months. Initially improved, now recurring. Some am nausea. Denies fevers, vomiting. She had WNL colonoscopy done 2010 for abdominal pain and dx IBS Carlean Purl) H/o concussion 03/2016 - ongoing headaches since then.  Preventative: Colonoscopy - normal 2010 Carlean Purl) OBGYN - Dr Toney Rakes. No h/o abnormal paps. H/o premature ovarian failure. rec DEXA at age 18 G38P3 (missed ab) LMP 2014  Spanish speaking From [place] Lives with husband, mother and son (2000) and daughter (2003)  Relevant past medical, surgical, family and social history reviewed and updated as indicated. Interim medical history since our last visit  reviewed. Allergies and medications reviewed and updated. Outpatient Medications Prior to Visit  Medication Sig Dispense Refill  . omeprazole (PRILOSEC) 20 MG capsule Take 20 mg by mouth daily.    . Vitamin D, Ergocalciferol, (DRISDOL) 50000 units CAPS capsule Take 1 capsule (50,000 Units total) by mouth every 7 (seven) days. 12 capsule 0  . losartan-hydrochlorothiazide (HYZAAR) 100-25 MG tablet Take 1 tablet by mouth daily. 30 tablet 2  . metFORMIN (GLUCOPHAGE) 500 MG tablet Take by mouth 2 (two) times daily with a meal.    . metroNIDAZOLE (FLAGYL) 500 MG tablet Take 1 tablet (500 mg total) by mouth 2 (two) times daily. 14 tablet 0   No facility-administered medications prior to visit.      Per HPI unless specifically indicated in ROS section below Review of Systems     Objective:    BP 130/88 (BP Location: Left Arm, Patient Position: Sitting, Cuff Size: Large)   Pulse 69   Temp 98.2 F (36.8 C) (Oral)   Ht 5\' 4"  (1.626 m)   Wt 194 lb (88 kg)   LMP 10/06/2014   SpO2 98%   BMI 33.30 kg/m   Wt Readings from Last 3 Encounters:  05/10/17 194 lb (88 kg)  11/30/16 201 lb (91.2 kg)  03/28/16 200 lb (90.7 kg)    Physical Exam  Constitutional: She appears well-developed and well-nourished. No distress.  HENT:  Head: Normocephalic and atraumatic.  Mouth/Throat: Oropharynx is  clear and moist. No oropharyngeal exudate.  Eyes: Pupils are equal, round, and reactive to light. Conjunctivae and EOM are normal. No scleral icterus.  Neck: Normal range of motion. Neck supple.  Cardiovascular: Normal rate, regular rhythm, normal heart sounds and intact distal pulses.   No murmur heard. Pulmonary/Chest: Effort normal and breath sounds normal. No respiratory distress. She has no wheezes. She has no rales.  Abdominal: Soft. Normal appearance and bowel sounds are normal. She exhibits no distension and no mass. There is no hepatosplenomegaly. There is tenderness in the epigastric area and left  lower quadrant. There is no rigidity, no rebound, no guarding, no CVA tenderness and negative Murphy's sign.  Musculoskeletal: She exhibits no edema.  See HPI for foot exam if done  Lymphadenopathy:    She has no cervical adenopathy.  Skin: Skin is warm and dry. No rash noted.  Psychiatric: She has a normal mood and affect.  Nursing note and vitals reviewed.  Results for orders placed or performed in visit on 05/10/17  Hemoglobin A1c  Result Value Ref Range   Hgb A1c MFr Bld 6.3 4.6 - 6.5 %  Basic metabolic panel  Result Value Ref Range   Sodium 139 135 - 145 mEq/L   Potassium 3.6 3.5 - 5.1 mEq/L   Chloride 104 96 - 112 mEq/L   CO2 27 19 - 32 mEq/L   Glucose, Bld 97 70 - 99 mg/dL   BUN 14 6 - 23 mg/dL   Creatinine, Ser 0.72 0.40 - 1.20 mg/dL   Calcium 9.5 8.4 - 10.5 mg/dL   GFR 93.28 >60.00 mL/min   Diabetic Foot Exam - Simple   No data filed    foot exam not done.    Assessment & Plan:  I have requested records from health serv to review. Problem List Items Addressed This Visit    Controlled type 2 diabetes mellitus without complication, without long-term current use of insulin ([place]) - Primary    Complaint with metformin bid. She also regularly checks sugars. Foot exam not done. Will check A1c today then decide on further treatment.  Will also await records from recent clinic evals.       Relevant Medications   losartan-hydrochlorothiazide (HYZAAR) 100-25 MG tablet   metFORMIN (GLUCOPHAGE) 500 MG tablet   Other Relevant Orders   Hemoglobin A1c (Completed)   Basic metabolic panel (Completed)   Early menopause occurring in patient age younger than 49 years   Essential hypertension    Chronic, stable. Continue current regimen of losartan hctz 100/25.       Relevant Medications   losartan-hydrochlorothiazide (HYZAAR) 100-25 MG tablet   Fibromyalgia    Carries this diagnosis. Will continue to assess.      GERD    H/o this, seems controlled with omeprazole 20mg   daily.      Head injury due to trauma    Suffered fall 03/2016 s/p ER eval with normal CT head and neck, dx possible concussion and intermittent headaches since then.      Irritable bowel syndrome (IBS)    Has seen GI. ?contributing to current GI distress /sxs. Consider trial probiotic.      Lower abdominal pain    Unclear cause of chronic LLQ discomfort associated with epigastric pain. ?IBS vs GERD related although pt denies significant GERD sxs. rec start probiotic, will check A1c and BMP today.       Premature ovarian failure    Appreciate GYN care of patient. Not on hormone replacement  therapy. rec baseline DEXA age 84 due to premature ovarian failure      Vitamin D deficiency    Continue weekly Rx-strength replacement per GYN          Follow up plan: Return in about 3 months (around 08/10/2017) for follow up visit.  Ria Bush, MD

## 2017-05-12 ENCOUNTER — Encounter: Payer: Self-pay | Admitting: Family Medicine

## 2017-05-12 DIAGNOSIS — E559 Vitamin D deficiency, unspecified: Secondary | ICD-10-CM | POA: Insufficient documentation

## 2017-05-12 DIAGNOSIS — M797 Fibromyalgia: Secondary | ICD-10-CM | POA: Insufficient documentation

## 2017-05-12 DIAGNOSIS — S0990XA Unspecified injury of head, initial encounter: Secondary | ICD-10-CM | POA: Insufficient documentation

## 2017-05-12 NOTE — Assessment & Plan Note (Signed)
Suffered fall 03/2016 s/p ER eval with normal CT head and neck, dx possible concussion and intermittent headaches since then.

## 2017-05-12 NOTE — Assessment & Plan Note (Signed)
Unclear cause of chronic LLQ discomfort associated with epigastric pain. ?IBS vs GERD related although pt denies significant GERD sxs. rec start probiotic, will check A1c and BMP today.

## 2017-05-12 NOTE — Assessment & Plan Note (Signed)
Has seen GI. ?contributing to current GI distress /sxs. Consider trial probiotic.

## 2017-05-12 NOTE — Assessment & Plan Note (Signed)
Chronic, stable. Continue current regimen of losartan hctz 100/25.

## 2017-05-12 NOTE — Assessment & Plan Note (Addendum)
Appreciate GYN care of patient. Not on hormone replacement therapy. rec baseline DEXA age 46 due to premature ovarian failure

## 2017-05-12 NOTE — Assessment & Plan Note (Addendum)
Complaint with metformin bid. She also regularly checks sugars. Foot exam not done. Will check A1c today then decide on further treatment.  Will also await records from recent clinic evals.

## 2017-05-12 NOTE — Assessment & Plan Note (Signed)
Carries this diagnosis. Will continue to assess.

## 2017-05-12 NOTE — Assessment & Plan Note (Addendum)
Continue weekly Rx-strength replacement per GYN

## 2017-05-12 NOTE — Assessment & Plan Note (Addendum)
H/o this, seems controlled with omeprazole 20mg  daily.

## 2017-08-09 ENCOUNTER — Ambulatory Visit: Payer: No Typology Code available for payment source | Admitting: Family Medicine

## 2017-08-27 LAB — HM DIABETES EYE EXAM

## 2017-08-30 ENCOUNTER — Encounter: Payer: Self-pay | Admitting: Family Medicine

## 2017-10-09 ENCOUNTER — Encounter: Payer: Self-pay | Admitting: Obstetrics & Gynecology

## 2017-10-09 ENCOUNTER — Ambulatory Visit (INDEPENDENT_AMBULATORY_CARE_PROVIDER_SITE_OTHER): Payer: Self-pay | Admitting: Obstetrics & Gynecology

## 2017-10-09 VITALS — BP 140/86

## 2017-10-09 DIAGNOSIS — R1032 Left lower quadrant pain: Secondary | ICD-10-CM

## 2017-10-09 DIAGNOSIS — R3129 Other microscopic hematuria: Secondary | ICD-10-CM

## 2017-10-09 DIAGNOSIS — N926 Irregular menstruation, unspecified: Secondary | ICD-10-CM

## 2017-10-09 DIAGNOSIS — N898 Other specified noninflammatory disorders of vagina: Secondary | ICD-10-CM

## 2017-10-09 DIAGNOSIS — Z113 Encounter for screening for infections with a predominantly sexual mode of transmission: Secondary | ICD-10-CM

## 2017-10-09 DIAGNOSIS — N950 Postmenopausal bleeding: Secondary | ICD-10-CM

## 2017-10-09 LAB — WET PREP FOR TRICH, YEAST, CLUE

## 2017-10-09 MED ORDER — SULFAMETHOXAZOLE-TRIMETHOPRIM 800-160 MG PO TABS: 1.0000 | ORAL_TABLET | Freq: Two times a day (BID) | ORAL | 0 refills | Status: AC

## 2017-10-09 MED ORDER — FLUCONAZOLE 150 MG PO TABS: 150.0000 mg | ORAL_TABLET | Freq: Once | ORAL | 0 refills | Status: AC

## 2017-10-09 NOTE — Patient Instructions (Addendum)
1. Vaginal itching Wet prep neg, no evidence of yeast vaginitis.  No lesion seen.  Reassured. - WET PREP FOR [place], YEAST, CLUE  2. Left lower quadrant pain Left lower quadrant pain with severe Hematuria and Calcium Oxalate crystals.  Probable Kidney Stone.  Recommend pushing PO H2O.  Refer to Urology asap.  3. Other microscopic hematuria Will treat for possible UTI.  Bactrim 1 tab PO BID x 3 days prescribed.  Fluconazole 1 tab PO as needed after.  Usage reviewed.  Pending U. Culture.  Otherwise as above.  Refer to Urology.  4. Postmenopausal bleeding R/O Gono-Chlam, testing done.  Menopausal x 2015, no HRT.  Vaginal bleeding x 3 days recently.  UPT neg.  R/O Endometrial pathology.  F/U Pelvic US.  - US Transvaginal Non-OB; Future  Other orders - sulfamethoxazole-trimethoprim (BACTRIM DS,SEPTRA DS) 800-160 MG tablet; Take 1 tablet by mouth 2 (two) times daily for 3 days. - fluconazole (DIFLUCAN) 150 MG tablet; Take 1 tablet (150 mg total) by mouth once for 1 dose. Take after antibiotics  Amy [name], fue un placer verla hoy!  Voy a informarla de sus [employer].   Clculos renales Kidney Stones Los clculos renales (urolitiasis) son depsitos slidos parecidos a una piedra que se forman dentro de los rganos que producen la orina (riones). Un clculo renal puede formarse en un rin y desplazarse a la vejiga, donde puede causar dolor intenso y obstruir el flujo de [place]. Los clculos renales se forman cuando hay niveles altos de ciertos minerales presentes en la orina. Suelen eliminarse a travs de la [place], [place], en algunos [place], se necesita tratamiento mdico para eliminarlos. Cules son las causas? Los clculos renales pueden ser causados por lo siguiente:  Una afeccin en la cual ciertas glndulas producen mucha hormona paratiroidea (hiperparatiroidismo primario), lo que causa demasiada acumulacin de calcio en la sangre.  La acumulacin de cristales de cido rico en la  vejiga (hiperuricuria). El cido rico es un qumico que el cuerpo produce cuando se ingieren determinados alimentos. Suele eliminarse a travs de Higher education careers adviser.  El Public librarian (estenosis) de los conductos que drenan orina de los riones a la vejiga (urteres).  Una obstruccin en el rin presente al nacer (obstruccin congnita).  Una ciruga realizada en el rin o los urteres, como una ciruga de revascularizacin.  Qu incrementa el riesgo? Los siguientes factores pueden hacer que usted sea propenso a formar clculos renales:  Haber tenido un clculo renal en el pasado.  Tener antecedentes familiares de clculos renales.  No beber suficiente agua.  Seguir una dieta rica en protenas, sal (sodio) o azcar.  Tener exceso de [place] u obesidad.  Cules son los signos o los sntomas? Los sntomas de clculos renales pueden incluir los siguientes:  Nuseas.  Vmitos.  Sangre en la orina (hematuria).  Dolor en el costado del abdomen, justo debajo de las costillas (dolor en fosa lumbar). Dolor que generalmente se expande (irradia) hacia la ingle.  Necesidad de Garment/textile technologist con frecuencia o [place].  Cmo se diagnostica? Esta afeccin se puede diagnosticar en funcin de lo siguiente:  Sus antecedentes mdicos.  Un examen fsico.  Anlisis de sangre.  Anlisis de [place].  Tomografa computarizada (TC).  Radiografa abdominal.  Una intervencin para examinar el interior de la vejiga (cistoscopia).  Cmo se trata? El tratamiento para los clculos renales depende del tamao, la ubicacin y la composicin de los clculos. El tratamiento puede incluir lo siguiente:  Physiological scientist la orina antes y despus de eliminar el clculo  a travs de [employer].  Supervisin en el hospital hasta eliminar el clculo a travs de la [place].  Aumentar la ingesta de lquidos y disminuir la cantidad de calcio y protenas en la dieta.  Una intervencin para romper los clculos en la vejiga  utilizando lo siguiente: ? Un haz de luz concentrado (terapia lser). ? Ondas de choque (litotricia extracorprea).  Ciruga para extraer los clculos renales. Esto ser necesario si siente dolor intenso o los clculos obstruyen las vas [place].  Siga estas instrucciones en su casa: Comida y bebida   Beba suficiente lquido para mantener la orina clara o de color amarillo plido. Esto lo ayudar a eliminar el clculo renal.  Si se lo indican, modifique la dieta. Esto puede incluir lo siguiente: ? Limitar la ingesta de sodio. ? Comer ms frutas y verduras. ? Limitar la ingesta de carne de res, carne de aves de corral, pescado y [place].  Siga las indicaciones del mdico respecto de las restricciones para las comidas o las bebidas. Instrucciones generales  Recoja una muestra de orina como se lo haya indicado el mdico. Deber recoger [place]ie C[place] de orina: ? 24horas despus de eliminar el clculo. ? Entre 8 y 12semanas despus de haber eliminado el clculo, y cada 6 a 48meses despus de haberlo eliminado.  Cuele la orina cada vez que orine segn las indicaciones del mdico. Use el colador que el mdico le haya recomendado.  No deseche el clculo renal despus de haberlo eliminado. Consrvelo para que el mdico Barrister's clerk. Analizar la composicin del clculo renal puede evitar la formacin de posibles clculos renales en el futuro.  Tome los medicamentos de venta libre y los recetados solamente como se lo haya indicado el mdico.  Consulting civil engineer a todas las visitas de control como se lo haya indicado el mdico. Esto es importante. Es posible que le realicen radiografas o ecografas para asegurarse de que haya eliminado el clculo. Cmo se evita? Para prevenir otro clculo renal:  Beba suficiente lquido para mantener la orina clara o de color amarillo plido. Esta es la mejor manera de prevenir la formacin de clculos renales.  Siga una dieta saludable y las recomendaciones de  su mdico respecto de los alimentos que Nurse, adult. Es posible que le indiquen que siga una dieta baja en protenas. Las recomendaciones varan segn el tipo de clculo renal que tenga.  Mantenga un peso saludable.  Comunquese con un mdico si:  Tiene un dolor que empeora o que no mejora con los medicamentos. Solicite ayuda de inmediato si:  Tiene fiebre o siente escalofros.  Siente dolor intenso.  Siente dolor abdominal.  Se desmaya.  No puede orinar. Esta informacin no tiene Marine scientist el consejo del mdico. Asegrese de hacerle al mdico cualquier pregunta que tenga. Document Released: 10/01/2005 Document Revised: 01/07/2017 Document Reviewed: 03/16/2016 Elsevier Interactive Patient Education  [employer].

## 2017-10-09 NOTE — Progress Notes (Signed)
    Amy [name] 13-Nov-1970 [id number]        46 y.o.  [id number] Married  RP:  Vaginal itching and burning for 3 days with left lower quadrant pain  HPI: In menopause since 2015.  No hormone replacement therapy.  Had mild vaginal bleeding for 3 days last week.  Last intercourse with withdrawal 2 weeks ago.  Complains of left lower quadrant pain radiating to her back and down her left leg for the last few days.  Also has vaginal itching and burning for the last 3 days.  Mild increase in vaginal discharge.  No urinary tract infection symptoms otherwise.  No fever.  Bowel movements normal.  Last annual gynecologic exam with Dr. Toney Rakes February 2018.  Past medical history,surgical history, problem list, medications, allergies, family history and social history were all reviewed and documented in the EPIC chart.  Directed ROS with pertinent positives and negatives documented in the history of present illness/assessment and plan.  Exam:  Vitals:   10/09/17 1520  BP: 140/86   General appearance:  Normal  CVAT negative bilaterally  Abdomen soft, NT, not distended  Gynecologic exam:  Vulva normal.  Speculum:  Cervic/Vagina normal.  Secretions normal.  Gono-Chlam done.  Wet prep done.     U/A Post ASO, difficult to interpret, but packed with RBCs and Calcium Oxalate present.  Pending U. Culture.  Wet prep negative    Assessment/Plan:  46 y.o. G4P0013   1. Vaginal itching Wet prep neg, no evidence of yeast vaginitis.  No lesion seen.  Reassured. - WET PREP FOR [place], YEAST, CLUE  2. Left lower quadrant pain Left lower quadrant pain with severe Hematuria and Calcium Oxalate crystals.  Probable Kidney Stone.  Recommend pushing PO H2O.  Refer to Urology asap.  3. Other microscopic hematuria Will treat for possible UTI.  Bactrim 1 tab PO BID x 3 days prescribed.  Fluconazole 1 tab PO as needed after.  Usage reviewed.  Pending U. Culture.  Otherwise as above.  Refer to Urology.  4.  Postmenopausal bleeding R/O Gono-Chlam, testing done.  Menopausal x 2015, no HRT.  Vaginal bleeding x 3 days recently.  UPT neg.  R/O Endometrial pathology.  F/U Pelvic US.  - US Transvaginal Non-OB; Future  Other orders - sulfamethoxazole-trimethoprim (BACTRIM DS,SEPTRA DS) 800-160 MG tablet; Take 1 tablet by mouth 2 (two) times daily for 3 days. - fluconazole (DIFLUCAN) 150 MG tablet; Take 1 tablet (150 mg total) by mouth once for 1 dose. Take after antibiotics  Counseling on above issues more than 50% for 25 minutes.  Princess Bruins MD, 3:42 PM 10/09/2017

## 2017-10-10 ENCOUNTER — Telehealth: Payer: Self-pay | Admitting: *Deleted

## 2017-10-10 LAB — PREGNANCY, URINE: Preg Test, Ur: NEGATIVE

## 2017-10-10 NOTE — Telephone Encounter (Signed)
Patient declined to be seen today at 1:15pm with NP, pt was driving out of town for holiday. States she will be back in Jan 2, and to schedule visit after this day. Pt now schedule on 10/18/17 @ 9:00am with NP, pt informed.

## 2017-10-10 NOTE — Telephone Encounter (Signed)
-----   Message from Princess Bruins, MD sent at 10/09/2017  4:07 PM EST ----- Regarding: Refer to Urologist Lt flank pain/blood and Calcium Oxalate in urine.  Probable Lt kidney stone.  Seen previously by Urologist at [facility].  Please organize referral to same Urologist asap.

## 2017-10-11 LAB — C. TRACHOMATIS/N. GONORRHOEAE RNA
C. TRACHOMATIS RNA, TMA: NOT DETECTED
N. GONORRHOEAE RNA, TMA: NOT DETECTED

## 2017-10-12 LAB — URINALYSIS W MICROSCOPIC + REFLEX CULTURE: Hyaline Cast: NONE SEEN /LPF

## 2017-10-12 LAB — URINE CULTURE
MICRO NUMBER: 81457040
Result:: NO GROWTH
SPECIMEN QUALITY: ADEQUATE

## 2017-10-12 LAB — CULTURE INDICATED

## 2017-10-14 ENCOUNTER — Encounter: Payer: Self-pay | Admitting: *Deleted

## 2017-11-14 ENCOUNTER — Other Ambulatory Visit: Payer: Self-pay

## 2017-11-14 ENCOUNTER — Ambulatory Visit: Payer: Self-pay | Admitting: Obstetrics & Gynecology

## 2018-01-04 ENCOUNTER — Encounter (HOSPITAL_COMMUNITY): Payer: Self-pay

## 2018-01-04 ENCOUNTER — Other Ambulatory Visit: Payer: Self-pay

## 2018-01-04 ENCOUNTER — Emergency Department (HOSPITAL_COMMUNITY)
Admission: EM | Admit: 2018-01-04 | Discharge: 2018-01-04 | Disposition: A | Discharge status: Home / Self Care | Payer: Self-pay | Attending: Emergency Medicine | Admitting: Emergency Medicine

## 2018-01-04 DIAGNOSIS — I10 Essential (primary) hypertension: Secondary | ICD-10-CM | POA: Insufficient documentation

## 2018-01-04 DIAGNOSIS — Z7984 Long term (current) use of oral hypoglycemic drugs: Secondary | ICD-10-CM | POA: Insufficient documentation

## 2018-01-04 DIAGNOSIS — R1010 Upper abdominal pain, unspecified: Secondary | ICD-10-CM | POA: Insufficient documentation

## 2018-01-04 DIAGNOSIS — E119 Type 2 diabetes mellitus without complications: Secondary | ICD-10-CM | POA: Insufficient documentation

## 2018-01-04 LAB — COMPREHENSIVE METABOLIC PANEL
ALBUMIN: 4.3 g/dL (ref 3.5–5.0)
ALK PHOS: 90 U/L (ref 38–126)
ALT: 25 U/L (ref 14–54)
ANION GAP: 12 (ref 5–15)
AST: 29 U/L (ref 15–41)
BILIRUBIN TOTAL: 0.9 mg/dL (ref 0.3–1.2)
BUN: 9 mg/dL (ref 6–20)
CALCIUM: 9.3 mg/dL (ref 8.9–10.3)
CO2: 26 mmol/L (ref 22–32)
Chloride: 103 mmol/L (ref 101–111)
Creatinine, Ser: 0.74 mg/dL (ref 0.44–1.00)
GFR calc non Af Amer: >60 mL/min (ref >60)
GLUCOSE: 128 mg/dL — AB (ref 65–99)
Potassium: 3.2 mmol/L — ABNORMAL LOW (ref 3.5–5.1)
Sodium: 141 mmol/L (ref 135–145)
Total Protein: 7.6 g/dL (ref 6.5–8.1)

## 2018-01-04 LAB — CBC
HCT: 46.1 % — ABNORMAL HIGH (ref 36.0–46.0)
HEMOGLOBIN: 15.6 g/dL — AB (ref 12.0–15.0)
MCH: 31.3 pg (ref 26.0–34.0)
MCHC: 33.8 g/dL (ref 30.0–36.0)
MCV: 92.4 fL (ref 78.0–100.0)
PLATELETS: 284 10*3/uL (ref 150–400)
RBC: 4.99 MIL/uL (ref 3.87–5.11)
RDW: 12.7 % (ref 11.5–15.5)
WBC: 6.8 10*3/uL (ref 4.0–10.5)

## 2018-01-04 LAB — URINALYSIS, ROUTINE W REFLEX MICROSCOPIC
Bilirubin Urine: NEGATIVE
GLUCOSE, UA: NEGATIVE mg/dL
HGB URINE DIPSTICK: NEGATIVE
Ketones, ur: 5 mg/dL — AB
Nitrite: NEGATIVE
PH: 9 — AB (ref 5.0–8.0)
PROTEIN: 100 mg/dL — AB
SPECIFIC GRAVITY, URINE: 1.021 (ref 1.005–1.030)

## 2018-01-04 LAB — I-STAT BETA HCG BLOOD, ED (MC, WL, AP ONLY): I-stat hCG, quantitative: <5 m[IU]/mL (ref <5)

## 2018-01-04 LAB — LIPASE, BLOOD: Lipase: 31 U/L (ref 11–51)

## 2018-01-04 MED ORDER — GI COCKTAIL ~~LOC~~
30.0000 mL | Freq: Once | ORAL | Status: AC
  Administered 2018-01-04: 30 mL via ORAL
  Filled 2018-01-04: qty 30

## 2018-01-04 MED ORDER — SUCRALFATE 1 G PO TABS: 1.0000 g | ORAL_TABLET | Freq: Three times a day (TID) | ORAL | 0 refills | Status: DC

## 2018-01-04 MED ORDER — DICYCLOMINE HCL 10 MG PO CAPS
10.0000 mg | ORAL_CAPSULE | Freq: Once | ORAL | Status: AC
  Administered 2018-01-04: 10 mg via ORAL
  Filled 2018-01-04: qty 1

## 2018-01-04 MED ORDER — OMEPRAZOLE 20 MG PO CPDR: 20.0000 mg | DELAYED_RELEASE_CAPSULE | Freq: Every day | ORAL | 0 refills | Status: DC

## 2018-01-04 NOTE — ED Provider Notes (Signed)
[facility]ENC[facility] DEPARTMENT Provider Note   CSN: [id number] Arrival date & time: 01/04/18  1009     History   Chief Complaint Chief Complaint  Patient presents with  . Abdominal Pain    HPI Amy [name] is a 47 y.o. female.  46 year old female with past medical history including IBS, fibromyalgia, hypertension, type 2 diabetes mellitus, premature ovarian failure who presents with abdominal pain.  Patient states that she has had at least one year of upper abdominal pain which she describes as intermittent epigastric to left upper quadrant pain that would come for a week and then leave for a month or so before returning.  She has seen a gastroenterologist in the past.  In January of this year, she developed 3-4 days of diarrheal illness and during that time her pain became worse and more constant.  She also began having pain radiating around to her back and burning radiating up into her face with facial flushing after eating.  She saw her gastroenterologist again and was started on cholestyramine.  She has not noticed any improvement in her symptoms with that medication.  She reports that pain is worse after eating especially after eating meat.  Yesterday evening she went to a friend's house and tried eating and enchilada but the pain became much worse.  She felt nauseated yesterday but has had no vomiting.  She has alternating diarrhea and constipation, no blood.  No urinary symptoms or vaginal bleeding/discharge.  No fevers.  She denies any heavy NSAID or alcohol use.  The history is provided by the patient.  Abdominal Pain      Past Medical History:  Diagnosis Date  . Diabetes mellitus without complication ([place])   . Fibromyalgia   . Hypertension   . IBS (irritable bowel syndrome)   . Missed abortion   . Premature ovarian failure 2014   not on HRT  . Vitamin D deficiency     Patient Active Problem List   Diagnosis Date Noted  . Head injury due to  trauma 05/12/2017  . Vitamin D deficiency   . Fibromyalgia   . Early menopause occurring in patient age younger than 49 years 11/30/2016  . Premature ovarian failure 10/15/2016  . Hemorrhoids 08/22/2009  . Lower abdominal pain 08/22/2009  . Irritable bowel syndrome (IBS) 04/14/2008  . Controlled type 2 diabetes mellitus without complication, without long-term current use of insulin ([place]) 04/14/2007  . ANXIETY 04/14/2007  . Essential hypertension 04/14/2007  . ALLERGIC RHINITIS 04/14/2007  . GERD 04/14/2007  . Osteopenia 04/14/2007  . HEADACHE 04/14/2007  . NEPHROLITHIASIS, HX OF 04/14/2007    Past Surgical History:  Procedure Laterality Date  . CESAREAN SECTION    . CHOLECYSTECTOMY  2004  . COLONOSCOPY  10/15/2008   WNL, IBS Carlean Purl)     OB History    Gravida  4   Para  3   Term      Preterm      AB  1   Living  3     SAB  1   TAB      Ectopic      Multiple      Live Births               Home Medications    Prior to Admission medications   Medication Sig Start Date End Date Taking? Authorizing Provider  Ascorbic Acid (VITAMIN C) 100 MG tablet Take 100 mg by mouth daily.   Yes [provider]  Biotin 5 MG CAPS Take 1 capsule by mouth daily.   Yes [provider]  cyanocobalamin (V-R VITAMIN B-12) 500 MCG tablet Take 1 tablet (500 mcg total) by mouth daily. 05/10/17  Yes Ria Bush, MD  losartan-hydrochlorothiazide (HYZAAR) 100-25 MG tablet Take 1 tablet by mouth daily. 05/10/17  Yes Ria Bush, MD  metFORMIN (GLUCOPHAGE) 500 MG tablet Take 1 tablet (500 mg total) by mouth 2 (two) times daily with a meal. 05/10/17  Yes Ria Bush, MD  glucose blood (RELION GLUCOSE TEST STRIPS) test strip Use as instructed 05/10/17   Ria Bush, MD    Family History Family History  Problem Relation Age of Onset  . Diabetes Mother   . Hypertension Mother   . Heart disease Mother   . Cancer Father        brain tumor  .  Diabetes Paternal Grandfather   . Stroke Neg Hx     Social History Social History   Tobacco Use  . Smoking status: Never Smoker  . Smokeless tobacco: Never Used  Substance Use Topics  . Alcohol use: Yes    Comment: rare  . Drug use: No     Allergies   Patient has no known allergies.   Review of Systems Review of Systems  Gastrointestinal: Positive for abdominal pain.   All other systems reviewed and are negative except that which was mentioned in HPI   Physical Exam Updated Vital Signs BP 137/75   Pulse (!) 59   Temp 98.7 F (37.1 C) (Oral)   Resp 16   Ht 5\' 6"  (1.676 m)   Wt 87.1 kg (192 lb)   LMP 10/06/2014   SpO2 96%   BMI 30.99 kg/m   Physical Exam  Constitutional: She is oriented to person, place, and time. She appears well-developed and well-nourished. No distress.  HENT:  Head: Normocephalic and atraumatic.  Moist mucous membranes  Eyes: Conjunctivae are normal.  Neck: Neck supple.  Cardiovascular: Normal rate, regular rhythm and normal heart sounds.  No murmur heard. Pulmonary/Chest: Effort normal and breath sounds normal.  Abdominal: Soft. Bowel sounds are normal. She exhibits no distension. There is tenderness.  Mild generalized TTP worst in LUQ  Musculoskeletal: She exhibits no edema.  Neurological: She is alert and oriented to person, place, and time.  Fluent speech  Skin: Skin is warm and dry.  Psychiatric: She has a normal mood and affect. Judgment normal.  Nursing note and vitals reviewed.    ED Treatments / Results  Labs (all labs ordered are listed, but only abnormal results are displayed) Labs Reviewed  COMPREHENSIVE METABOLIC PANEL - Abnormal; Notable for the following components:      Result Value   Potassium 3.2 (*)    Glucose, Bld 128 (*)    All other components within normal limits  CBC - Abnormal; Notable for the following components:   Hemoglobin 15.6 (*)    HCT 46.1 (*)    All other components within normal limits    URINALYSIS, ROUTINE W REFLEX MICROSCOPIC - Abnormal; Notable for the following components:   APPearance HAZY (*)    pH 9.0 (*)    Ketones, ur 5 (*)    Protein, ur 100 (*)    Leukocytes, UA TRACE (*)    Bacteria, UA RARE (*)    Squamous Epithelial / LPF 6-30 (*)    All other components within normal limits  LIPASE, BLOOD  I-STAT BETA HCG BLOOD, ED ([facility], [facility], [facility] ONLY)  EKG None  Radiology No results found.  Procedures Procedures (including critical care time)  Medications Ordered in ED Medications  gi cocktail (Maalox,Lidocaine,Donnatal) (has no administration in time range)  dicyclomine (BENTYL) capsule 10 mg (has no administration in time range)     Initial Impression / Assessment and Plan / ED Course  I have reviewed the triage vital signs and the nursing notes.  Pertinent labs  that were available during my care of the patient were reviewed by me and considered in my medical decision making (see chart for details).    Pt well appearing without focal RUQ or lower abdominal tenderness. Labs show normal LFTs and lipase, normal WBC count, normal creatinine. She reports not being able to eat anything at all but her weight is only 1 lb different from office visit several months ago.  Given the chronicity of her symptoms I do not feel she would benefit from CT or ultrasound imaging at this time especially given that she has had a cholecystectomy.  I have recommended restarting PPI along with carafate and following up with GI to consider H pylori testing and possible endoscopy if post-prandial sx continue. Counseled patient on GERD diet.   Final Clinical Impressions(s) / ED Diagnoses   Final diagnoses:  None    ED Discharge Orders    None       , Wenda Overland, MD 01/04/18 1245

## 2018-01-04 NOTE — ED Notes (Signed)
Pt stable, ambulatory, states understanding of discharge instructions 

## 2018-01-04 NOTE — ED Triage Notes (Addendum)
Pt arrives from home with c/o abdominal pain since January 2018. Pt saw GI MD months ago and was given cholestyramine to try for 2 months but states pain has not improved and pain was much worse last night. Pt has had her gall bladder removed 14 years ago. Unable to eat or drink without pain. Also c/o back pain and burninig/pressure at head and ears.

## 2018-01-06 ENCOUNTER — Other Ambulatory Visit: Payer: Self-pay | Admitting: Gastroenterology

## 2018-01-06 DIAGNOSIS — R1084 Generalized abdominal pain: Secondary | ICD-10-CM

## 2018-01-13 HISTORY — PX: ESOPHAGOGASTRODUODENOSCOPY: SHX1529

## 2018-01-13 HISTORY — PX: COLONOSCOPY: SHX174

## 2018-01-16 ENCOUNTER — Telehealth: Payer: Self-pay

## 2018-01-16 NOTE — Telephone Encounter (Signed)
Received a faxed Need for Clarification from [facility] stating combo med losartan/HCTZ 100-25 mg is on backorder.  Requesting separate rxs. Form placed in Dr. Synthia Innocent box.

## 2018-01-17 MED ORDER — LOSARTAN POTASSIUM 100 MG PO TABS: 100.0000 mg | ORAL_TABLET | Freq: Every day | ORAL | 1 refills | Status: DC

## 2018-01-17 MED ORDER — HYDROCHLOROTHIAZIDE 25 MG PO TABS: 25.0000 mg | ORAL_TABLET | Freq: Every day | ORAL | 1 refills | Status: DC

## 2018-01-17 NOTE — Telephone Encounter (Signed)
Sent in separate meds. 

## 2018-01-25 ENCOUNTER — Ambulatory Visit
Admission: RE | Admit: 2018-01-25 | Discharge: 2018-01-25 | Disposition: A | Discharge status: Home / Self Care | Payer: Self-pay | Source: Ambulatory Visit | Attending: Gastroenterology | Admitting: Gastroenterology

## 2018-01-25 DIAGNOSIS — R1084 Generalized abdominal pain: Secondary | ICD-10-CM

## 2018-01-25 MED ORDER — IOPAMIDOL (ISOVUE-300) INJECTION 61%
125.0000 mL | Freq: Once | INTRAVENOUS | Status: AC | PRN
  Administered 2018-01-25: 125 mL via INTRAVENOUS

## 2018-02-14 ENCOUNTER — Other Ambulatory Visit: Payer: Self-pay

## 2018-05-12 IMAGING — DX DG THORACIC SPINE 2V
3 series · 3 of 3 positions shown · non-contrast
Comparison: Radiographs August 10, 2010.

CLINICAL DATA: Acute mid back pain after fall today. Initial
encounter.

EXAM:
THORACIC SPINE 2 VIEWS

[t-spine ap]
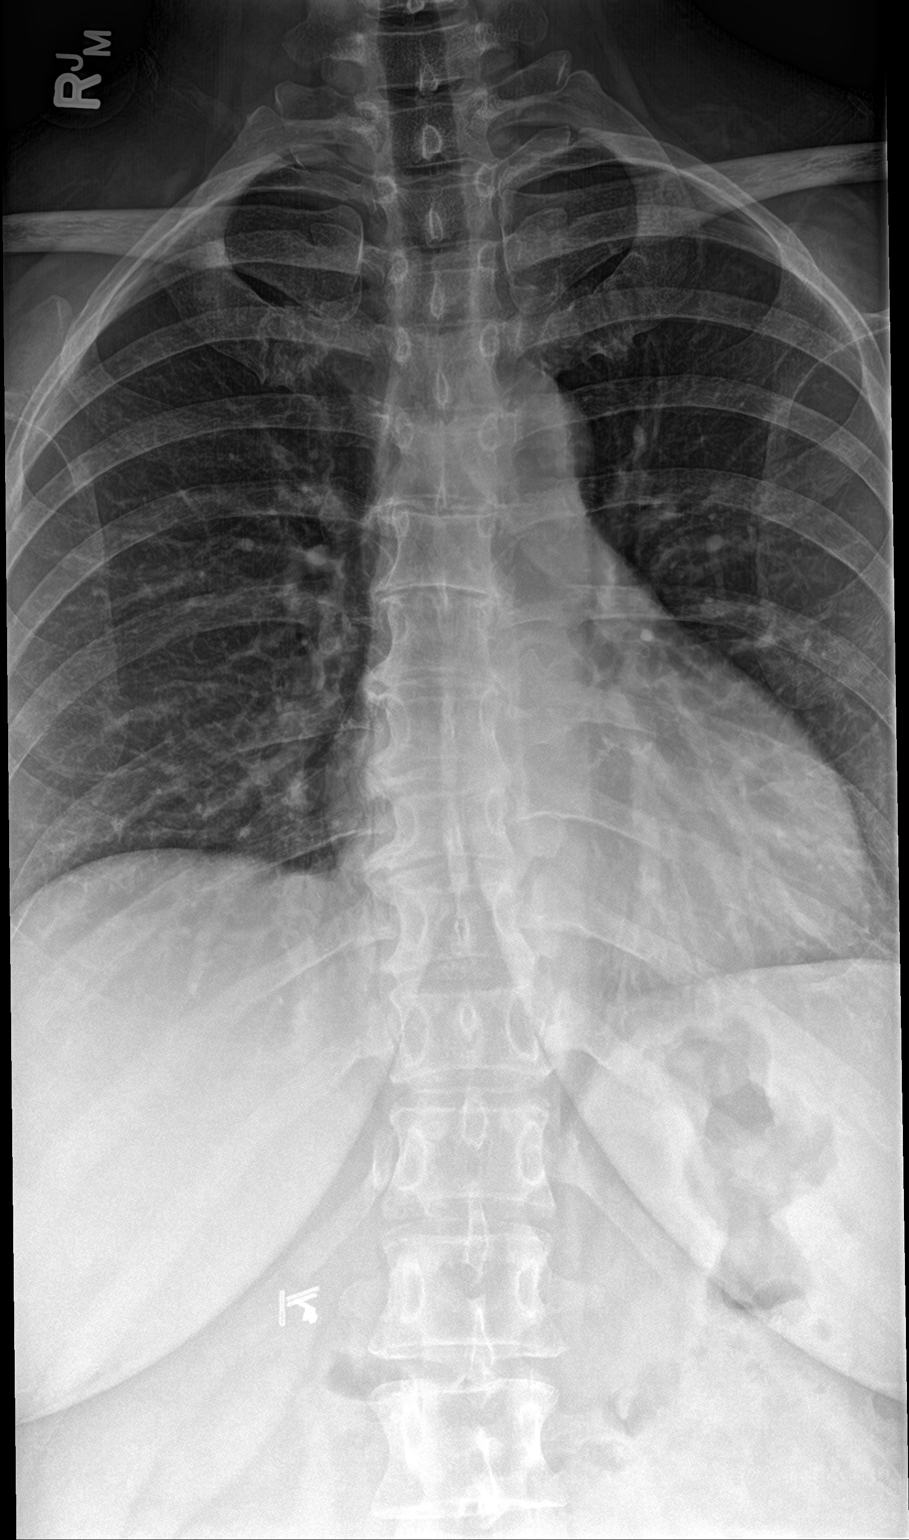

[t-spine lat]
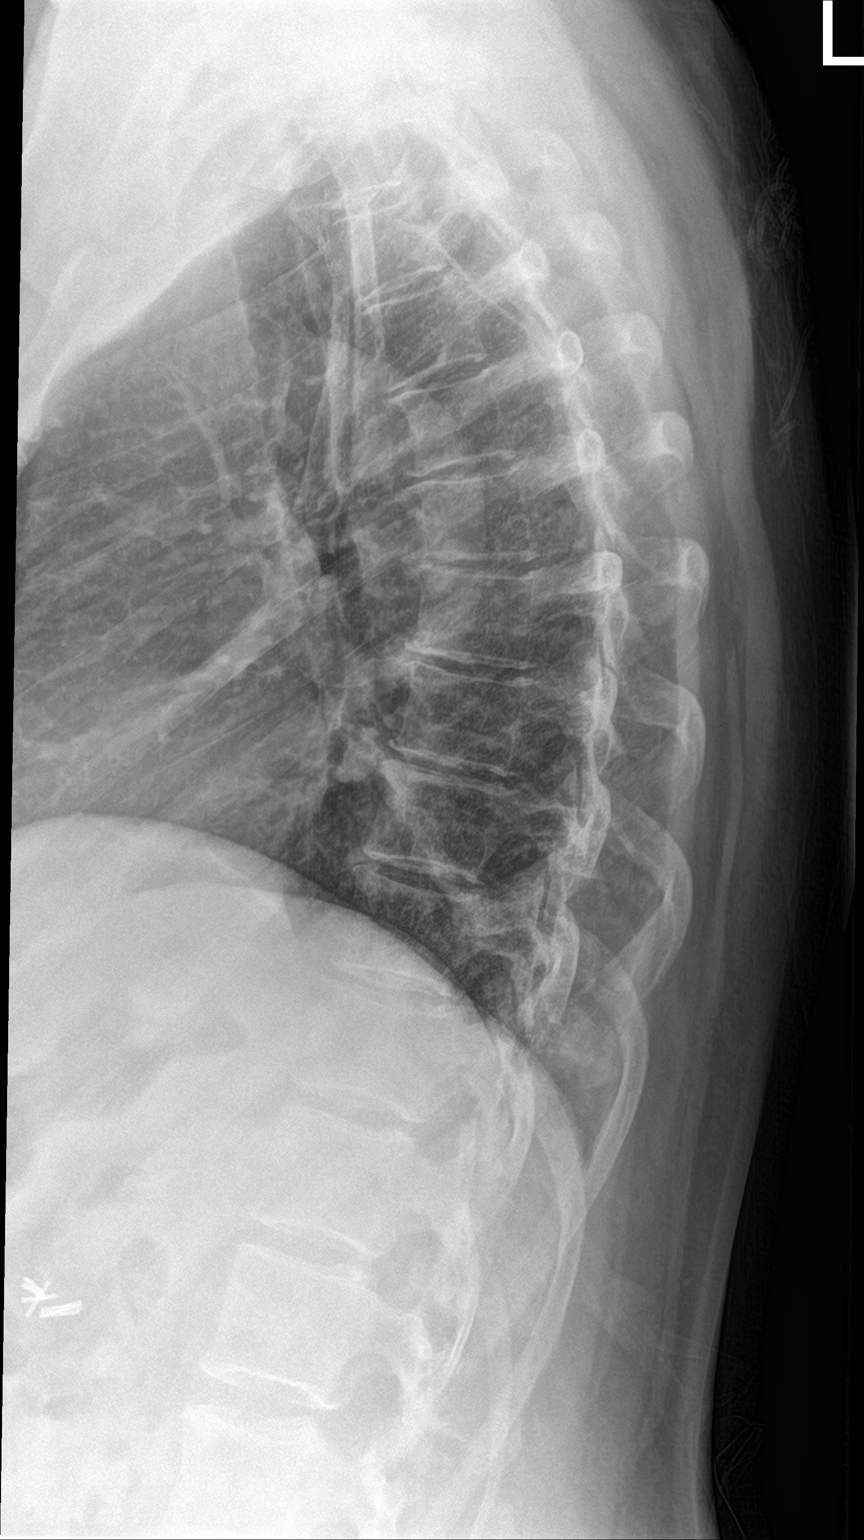

[t-spine swimmers]
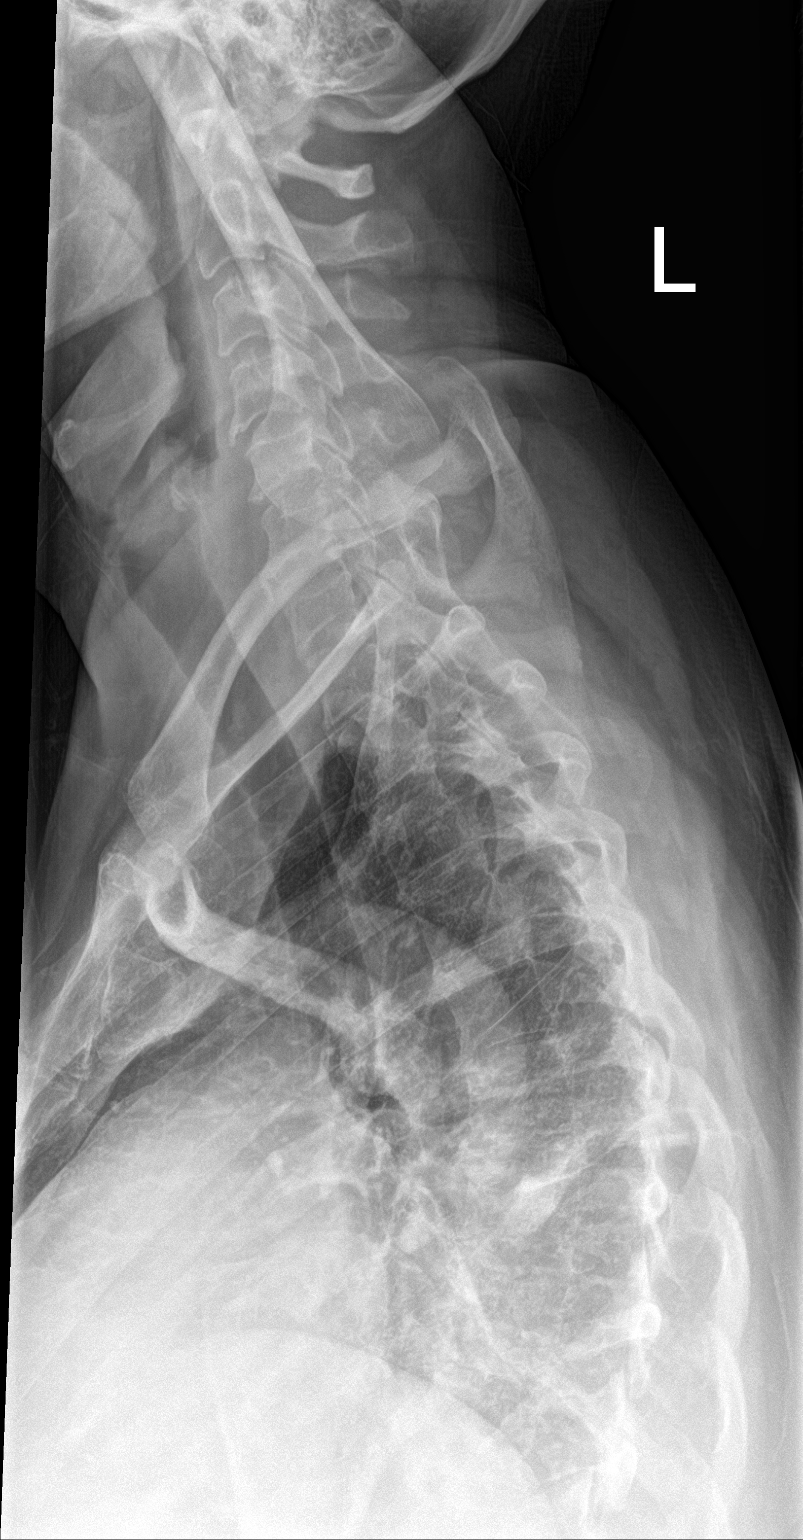

[3 of 3 positions shown; findings below may reference images not displayed]

FINDINGS: No fracture or spondylolisthesis is noted. Mild osteophyte formation
is seen involving the lower thoracic spine. Disc spaces appear to be
well maintained.
IMPRESSION: Mild degenerative changes as described above. No acute abnormality
seen in the thoracic spine.

## 2018-05-27 ENCOUNTER — Ambulatory Visit: Payer: Self-pay | Admitting: Family Medicine

## 2018-05-27 ENCOUNTER — Encounter: Payer: Self-pay | Admitting: Family Medicine

## 2018-05-27 VITALS — BP 120/84 | HR 57 | Temp 98.4°F | Ht 64.0 in | Wt 168.5 lb

## 2018-05-27 DIAGNOSIS — I10 Essential (primary) hypertension: Secondary | ICD-10-CM

## 2018-05-27 DIAGNOSIS — R350 Frequency of micturition: Secondary | ICD-10-CM

## 2018-05-27 DIAGNOSIS — E119 Type 2 diabetes mellitus without complications: Secondary | ICD-10-CM

## 2018-05-27 DIAGNOSIS — K219 Gastro-esophageal reflux disease without esophagitis: Secondary | ICD-10-CM

## 2018-05-27 DIAGNOSIS — R300 Dysuria: Secondary | ICD-10-CM

## 2018-05-27 DIAGNOSIS — R21 Rash and other nonspecific skin eruption: Secondary | ICD-10-CM

## 2018-05-27 LAB — POC URINALSYSI DIPSTICK (AUTOMATED)
Bilirubin, UA: NEGATIVE
Blood, UA: NEGATIVE
Glucose, UA: NEGATIVE
Ketones, UA: NEGATIVE
LEUKOCYTES UA: NEGATIVE
Nitrite, UA: NEGATIVE
PROTEIN UA: NEGATIVE
SPEC GRAV UA: 1.01 (ref 1.010–1.025)
UROBILINOGEN UA: 0.2 U/dL
pH, UA: 7 (ref 5.0–8.0)

## 2018-05-27 MED ORDER — TRIAMCINOLONE ACETONIDE 0.1 % EX CREA: 1.0000 "application " | TOPICAL_CREAM | Freq: Two times a day (BID) | CUTANEOUS | 0 refills | Status: AC

## 2018-05-27 MED ORDER — HYDROCHLOROTHIAZIDE 25 MG PO TABS: 25.0000 mg | ORAL_TABLET | Freq: Every day | ORAL | 3 refills | Status: DC

## 2018-05-27 MED ORDER — OMEPRAZOLE 20 MG PO CPDR: 20.0000 mg | DELAYED_RELEASE_CAPSULE | Freq: Every day | ORAL | 3 refills | Status: DC | PRN

## 2018-05-27 MED ORDER — METFORMIN HCL 500 MG PO TABS: 500.0000 mg | ORAL_TABLET | Freq: Two times a day (BID) | ORAL | 3 refills | Status: DC

## 2018-05-27 NOTE — Progress Notes (Signed)
BP 120/84 (BP Location: Left Arm, Patient Position: Sitting, Cuff Size: Normal)   Pulse (!) 57   Temp 98.4 F (36.9 C) (Oral)   Ht 5\' 4"  (1.626 m)   Wt 168 lb 8 oz (76.4 kg)   LMP 10/06/2014   SpO2 98%   BMI 28.92 kg/m    CC: med refill visit Subjective:    Patient ID: Amy [name], female    DOB: 12-21-1970, 47 y.o.   MRN: [id number]  HPI: Amy [name] is a 47 y.o. female presenting on 05/27/2018 for Medication Refill (Requests refills for DM and HTN meds. Pt accompanied by her husband, Destin. ); Urinary Frequency (C/o urinary frequency and burning with urination. Started about 5 days ago. ); and Rash (Has rash on right side of back. Noticed on 05/23/18. )   Last seen 05/10/2017. Asks about family coming to see me.  LLQ pain - seen by GI with stable EGD/colonoscopy [facility]) - I have requested she bring copy of records to review. CT showed fatty liver - has decreased greasy foods and increased vegetables, has lost significant weight - up to 30 lbs! Now walking regularly as a routine.   DM - checks sugars fasting in the morning - 90s.  Lab Results  Component Value Date   HGBA1C 6.3 05/10/2017     HTN - states home readings well controlled. Denies dyspnea, chest pain, leg swelling. occasional HA. She has only been taking HCTZ.   Urinary frequency and dysuria over the last month.   Skin rash present for the past week - no recent new beds, no new medicines, no sick contacts at home.   Relevant past medical, surgical, family and social history reviewed and updated as indicated. Interim medical history since our last visit reviewed. Allergies and medications reviewed and updated. Outpatient Medications Prior to Visit  Medication Sig Dispense Refill  . Biotin 5 MG CAPS Take 1 capsule by mouth daily.    . cyanocobalamin (V-R VITAMIN B-12) 500 MCG tablet Take 1 tablet (500 mcg total) by mouth daily.    Marland Kitchen glucose blood (RELION GLUCOSE TEST STRIPS) test strip Use as instructed 100  each 12  . Ascorbic Acid (VITAMIN C) 100 MG tablet Take 100 mg by mouth daily.    . hydrochlorothiazide (HYDRODIURIL) 25 MG tablet Take 1 tablet (25 mg total) by mouth daily. 90 tablet 1  . losartan (COZAAR) 100 MG tablet Take 1 tablet (100 mg total) by mouth daily. 90 tablet 1  . losartan-hydrochlorothiazide (HYZAAR) 100-25 MG tablet Take 1 tablet by mouth daily. 90 tablet 1  . metFORMIN (GLUCOPHAGE) 500 MG tablet Take 1 tablet (500 mg total) by mouth 2 (two) times daily with a meal. 180 tablet 1  . omeprazole (PRILOSEC) 20 MG capsule Take 1 capsule (20 mg total) by mouth daily. 30 capsule 0  . sucralfate (CARAFATE) 1 g tablet Take 1 tablet (1 g total) by mouth 4 (four) times daily -  with meals and at bedtime for 7 days. 28 tablet 0   No facility-administered medications prior to visit.      Per HPI unless specifically indicated in ROS section below Review of Systems     Objective:    BP 120/84 (BP Location: Left Arm, Patient Position: Sitting, Cuff Size: Normal)   Pulse (!) 57   Temp 98.4 F (36.9 C) (Oral)   Ht 5\' 4"  (1.626 m)   Wt 168 lb 8 oz (76.4 kg)   LMP 10/06/2014  SpO2 98%   BMI 28.92 kg/m   Wt Readings from Last 3 Encounters:  05/27/18 168 lb 8 oz (76.4 kg)  01/04/18 192 lb (87.1 kg)  05/10/17 194 lb (88 kg)    Physical Exam  Constitutional: She appears well-developed and well-nourished. No distress.  Skin: Skin is warm and dry. Rash noted.  Pruritic rash on trunk and legs, crosses midline, no vesicular rash. Central papules with surrounding erythema presumed from excoriation.   Nursing note and vitals reviewed.  Results for orders placed or performed in visit on 05/27/18  POCT Urinalysis Dipstick (Automated)  Result Value Ref Range   Color, UA straw    Clarity, UA clear    Glucose, UA Negative Negative   Bilirubin, UA negative    Ketones, UA negative    Spec Grav, UA 1.010 1.010 - 1.025   Blood, UA negative    pH, UA 7.0 5.0 - 8.0   Protein, UA Negative  Negative   Urobilinogen, UA 0.2 0.2 or 1.0 E.U./dL   Nitrite, UA negative    Leukocytes, UA Negative Negative      Assessment & Plan:   Problem List Items Addressed This Visit    Urinary frequency    UA normal. Reviewed avoiding bladder irritants - she did recenly start weight loss supplements which may be contributing - advised stop this.      Relevant Orders   POCT Urinalysis Dipstick (Automated) (Completed)   Skin rash    Anticipate bug bites - Rx triamcinolone cream PRN, update with effect.  Not consistent with PE or with migraines.       GERD    Improved with weight loss, now using PPI PRN - refilled today.       Relevant Medications   omeprazole (PRILOSEC) 20 MG capsule   Essential hypertension    Chronic, stable. Has only been taking hctz 25mg  daily - not losartan (this was confirmed with pharmacy). Given good BP control today and recent significant weight loss, will continue only hctz 25mg  daily. Check K and Cr today to ensure tolerating this change.       Relevant Medications   hydrochlorothiazide (HYDRODIURIL) 25 MG tablet   Controlled type 2 diabetes mellitus without complication, without long-term current use of insulin ([facility]) - Primary    Chronic. Anticipate marked improvement with recent healthy diet changes and weight loss. Encouraged continued efforts. Check A1c today, continue metformin 500mg  bid. Reassess at f/u visit in 3 months - reviewed I do want to see her more frequently than yearly to help monitor these chronic medical conditions.      Relevant Medications   metFORMIN (GLUCOPHAGE) 500 MG tablet   Other Relevant Orders   Hemoglobin A1c   Comprehensive metabolic panel       Meds ordered this encounter  Medications  . metFORMIN (GLUCOPHAGE) 500 MG tablet    Sig: Take 1 tablet (500 mg total) by mouth 2 (two) times daily with a meal.    Dispense:  180 tablet    Refill:  3  . omeprazole (PRILOSEC) 20 MG capsule    Sig: Take 1 capsule (20 mg total)  by mouth daily as needed (heartburn).    Dispense:  30 capsule    Refill:  3  . hydrochlorothiazide (HYDRODIURIL) 25 MG tablet    Sig: Take 1 tablet (25 mg total) by mouth daily.    Dispense:  90 tablet    Refill:  3  . triamcinolone cream (KENALOG) 0.1 %  Sig: Apply 1 application topically 2 (two) times daily. Apply to AA.    Dispense:  45 g    Refill:  0   Orders Placed This Encounter  Procedures  . Hemoglobin A1c  . Comprehensive metabolic panel  . POCT Urinalysis Dipstick (Automated)    Follow up plan: Return in about 3 months (around 08/27/2018) for follow up visit.  Ria Bush, MD

## 2018-05-27 NOTE — Patient Instructions (Addendum)
[facility] records de endoscopia y colonoscopia.  Medicinas rellenadas hoy.  Regresar en 3 meses para segiumiento Para brote - use medicine topical para rasquia (triamcinolone steroid cream).

## 2018-05-28 ENCOUNTER — Encounter: Payer: Self-pay | Admitting: Women's Health

## 2018-05-28 ENCOUNTER — Ambulatory Visit (INDEPENDENT_AMBULATORY_CARE_PROVIDER_SITE_OTHER): Payer: Self-pay | Admitting: Women's Health

## 2018-05-28 VITALS — BP 122/78

## 2018-05-28 DIAGNOSIS — R300 Dysuria: Secondary | ICD-10-CM

## 2018-05-28 DIAGNOSIS — R350 Frequency of micturition: Secondary | ICD-10-CM | POA: Insufficient documentation

## 2018-05-28 DIAGNOSIS — R21 Rash and other nonspecific skin eruption: Secondary | ICD-10-CM | POA: Insufficient documentation

## 2018-05-28 LAB — COMPREHENSIVE METABOLIC PANEL
ALK PHOS: 77 U/L (ref 39–117)
ALT: 17 U/L (ref 0–35)
AST: 22 U/L (ref 0–37)
Albumin: 4.5 g/dL (ref 3.5–5.2)
BUN: 14 mg/dL (ref 6–23)
CO2: 29 mEq/L (ref 19–32)
Calcium: 10.2 mg/dL (ref 8.4–10.5)
Chloride: 102 mEq/L (ref 96–112)
Creatinine, Ser: 0.76 mg/dL (ref 0.40–1.20)
GFR: 87.23 mL/min (ref >60.00)
GLUCOSE: 88 mg/dL (ref 70–99)
Potassium: 3.5 mEq/L (ref 3.5–5.1)
Sodium: 139 mEq/L (ref 135–145)
TOTAL PROTEIN: 7.8 g/dL (ref 6.0–8.3)
Total Bilirubin: 0.5 mg/dL (ref 0.2–1.2)

## 2018-05-28 LAB — HEMOGLOBIN A1C: Hgb A1c MFr Bld: 5.8 % (ref 4.6–6.5)

## 2018-05-28 MED ORDER — SULFAMETHOXAZOLE-TRIMETHOPRIM 800-160 MG PO TABS: 1.0000 | ORAL_TABLET | Freq: Two times a day (BID) | ORAL | 0 refills | Status: DC

## 2018-05-28 NOTE — Assessment & Plan Note (Addendum)
Chronic. Anticipate marked improvement with recent healthy diet changes and weight loss. Encouraged continued efforts. Check A1c today, continue metformin 500mg  bid. Reassess at f/u visit in 3 months - reviewed I do want to see her more frequently than yearly to help monitor these chronic medical conditions.

## 2018-05-28 NOTE — Assessment & Plan Note (Signed)
UA normal. Reviewed avoiding bladder irritants - she did recenly start weight loss supplements which may be contributing - advised stop this.

## 2018-05-28 NOTE — Assessment & Plan Note (Signed)
Anticipate bug bites - Rx triamcinolone cream PRN, update with effect.  Not consistent with PE or with migraines.

## 2018-05-28 NOTE — Patient Instructions (Signed)

## 2018-05-28 NOTE — Progress Notes (Signed)
47 year old MHF G4, P3 presents with complaint of urinary frequency with urgency, burning sensation especially at initiation but some at end of stream of urination for the past week.  Had a negative UA at primary care yesterday.  Denies vaginal discharge, abdominal pain, back pain, nausea or fever.  Postmenopausal on no HRT.  Primary care manages diabetes and hypertension.  Has lost 45 pounds over the past 6 months with diet and exercise due to diabetes diagnosis.    Exam: Appears well.  No CVAT.  Abdomen soft, obese, external genitalia mild erythema and introitus, speculum exam no visible erythema or discharge, wet prep negative.  Bimanual no CMT or adnexal tenderness. UA: Trace leukocytes, 0-5 WBCs, no bacteria, no RBCs.  Possible UTI  Plan: Options reviewed, would like to try something Septra twice daily for 3 days, prescription, proper use given and reviewed, urine culture pending.  Reviewed importance of fluids, UTI prevention discussed.  Instructed to call if continued problems.  Congratulated on weight loss and healthy lifestyle.

## 2018-05-28 NOTE — Assessment & Plan Note (Signed)
Chronic, stable. Has only been taking hctz 25mg  daily - not losartan (this was confirmed with pharmacy). Given good BP control today and recent significant weight loss, will continue only hctz 25mg  daily. Check K and Cr today to ensure tolerating this change.

## 2018-05-28 NOTE — Assessment & Plan Note (Signed)
Improved with weight loss, now using PPI PRN - refilled today.

## 2018-05-30 ENCOUNTER — Encounter: Payer: Self-pay | Admitting: *Deleted

## 2018-05-30 LAB — URINALYSIS, COMPLETE W/RFL CULTURE
BACTERIA UA: NONE SEEN /HPF
Bilirubin Urine: NEGATIVE
GLUCOSE, UA: NEGATIVE
Hgb urine dipstick: NEGATIVE
Hyaline Cast: NONE SEEN /LPF
Ketones, ur: NEGATIVE
Nitrites, Initial: NEGATIVE
PROTEIN: NEGATIVE
RBC / HPF: NONE SEEN /HPF (ref 0–2)
Specific Gravity, Urine: 1.003 (ref 1.001–1.03)
pH: 5.5 (ref 5.0–8.0)

## 2018-05-30 LAB — URINE CULTURE
MICRO NUMBER:: 90970730
SPECIMEN QUALITY:: ADEQUATE

## 2018-05-30 LAB — CULTURE INDICATED

## 2018-05-31 ENCOUNTER — Other Ambulatory Visit: Payer: Self-pay | Admitting: Family Medicine

## 2018-06-03 ENCOUNTER — Telehealth: Payer: Self-pay | Admitting: Family Medicine

## 2018-06-03 NOTE — Telephone Encounter (Signed)
Pt dropped of medical records from [facility]. Place on cart

## 2018-06-04 ENCOUNTER — Telehealth: Payer: Self-pay | Admitting: *Deleted

## 2018-06-04 MED ORDER — CIPROFLOXACIN HCL 250 MG PO TABS: 250.0000 mg | ORAL_TABLET | Freq: Two times a day (BID) | ORAL | 0 refills | Status: DC

## 2018-06-04 NOTE — Telephone Encounter (Signed)
Rosemarie Ax will you relay the information to patient if itching I can send in diflucan Rx.

## 2018-06-04 NOTE — Telephone Encounter (Signed)
Have her try over-the-counter Azo take 1 tablet 3-4 times daily, continue to drink plenty of water, the antibiotic should have taking care of it.  If she has any itching okay for Diflucan 150 mg 1 dose.

## 2018-06-04 NOTE — Telephone Encounter (Signed)
She was prescribed Septra which is effective for infection shown, could do Cipro 250 twice daily for 3 days.  Have her call if continued problems after completing.

## 2018-06-04 NOTE — Telephone Encounter (Signed)
Amy [name] see below, Rx sent.

## 2018-06-04 NOTE — Telephone Encounter (Signed)
Patient was prescribed Bactrim x 3 days dose at [facility] 05/28/18, told to call if symptoms not improved. patient called Rosemarie Ax and still c/o burning with urination, asked what to do next? Please advise

## 2018-06-04 NOTE — Telephone Encounter (Signed)
Amy [name] spoke with patient and patient she has been taking the Azo and no relief, she is not having any itching. Just pain with urination.

## 2018-06-05 NOTE — Telephone Encounter (Signed)
Claudia informed patient yesterday

## 2018-06-09 ENCOUNTER — Ambulatory Visit: Payer: Self-pay | Admitting: Family Medicine

## 2018-06-16 ENCOUNTER — Encounter: Payer: Self-pay | Admitting: Family Medicine

## 2018-06-16 NOTE — Telephone Encounter (Signed)
Records reviewed and sent for scanning

## 2018-07-18 ENCOUNTER — Emergency Department (HOSPITAL_COMMUNITY)
Admission: EM | Admit: 2018-07-18 | Discharge: 2018-07-18 | Disposition: A | Discharge status: Home / Self Care | Payer: Self-pay | Attending: Emergency Medicine | Admitting: Emergency Medicine

## 2018-07-18 ENCOUNTER — Encounter: Payer: Self-pay | Admitting: Family Medicine

## 2018-07-18 ENCOUNTER — Other Ambulatory Visit: Payer: Self-pay

## 2018-07-18 ENCOUNTER — Ambulatory Visit: Payer: Self-pay | Admitting: Family Medicine

## 2018-07-18 ENCOUNTER — Encounter (HOSPITAL_COMMUNITY): Payer: Self-pay

## 2018-07-18 ENCOUNTER — Emergency Department (HOSPITAL_COMMUNITY): Payer: Self-pay

## 2018-07-18 DIAGNOSIS — Z79899 Other long term (current) drug therapy: Secondary | ICD-10-CM | POA: Insufficient documentation

## 2018-07-18 DIAGNOSIS — J069 Acute upper respiratory infection, unspecified: Secondary | ICD-10-CM | POA: Insufficient documentation

## 2018-07-18 DIAGNOSIS — I10 Essential (primary) hypertension: Secondary | ICD-10-CM | POA: Insufficient documentation

## 2018-07-18 DIAGNOSIS — E119 Type 2 diabetes mellitus without complications: Secondary | ICD-10-CM | POA: Insufficient documentation

## 2018-07-18 DIAGNOSIS — H66003 Acute suppurative otitis media without spontaneous rupture of ear drum, bilateral: Secondary | ICD-10-CM | POA: Insufficient documentation

## 2018-07-18 DIAGNOSIS — R202 Paresthesia of skin: Secondary | ICD-10-CM | POA: Insufficient documentation

## 2018-07-18 DIAGNOSIS — Z7984 Long term (current) use of oral hypoglycemic drugs: Secondary | ICD-10-CM | POA: Insufficient documentation

## 2018-07-18 LAB — CBC
HEMATOCRIT: 43.8 % (ref 36.0–46.0)
HEMOGLOBIN: 14.2 g/dL (ref 12.0–15.0)
MCH: 30 pg (ref 26.0–34.0)
MCHC: 32.4 g/dL (ref 30.0–36.0)
MCV: 92.6 fL (ref 78.0–100.0)
PLATELETS: 268 10*3/uL (ref 150–400)
RBC: 4.73 MIL/uL (ref 3.87–5.11)
RDW: 12.8 % (ref 11.5–15.5)
WBC: 6.6 10*3/uL (ref 4.0–10.5)

## 2018-07-18 LAB — BASIC METABOLIC PANEL
Anion gap: 7 (ref 5–15)
BUN: 9 mg/dL (ref 6–20)
CO2: 26 mmol/L (ref 22–32)
CREATININE: 0.68 mg/dL (ref 0.44–1.00)
Calcium: 9.4 mg/dL (ref 8.9–10.3)
Chloride: 108 mmol/L (ref 98–111)
GFR calc non Af Amer: >60 mL/min (ref >60)
Glucose, Bld: 92 mg/dL (ref 70–99)
Potassium: 3.6 mmol/L (ref 3.5–5.1)
Sodium: 141 mmol/L (ref 135–145)

## 2018-07-18 LAB — I-STAT BETA HCG BLOOD, ED (MC, WL, AP ONLY)

## 2018-07-18 LAB — I-STAT TROPONIN, ED: Troponin i, poc: 0 ng/mL (ref 0.00–0.08)

## 2018-07-18 MED ORDER — PROCHLORPERAZINE EDISYLATE 10 MG/2ML IJ SOLN
10.0000 mg | Freq: Once | INTRAMUSCULAR | Status: AC
  Administered 2018-07-18: 10 mg via INTRAMUSCULAR
  Filled 2018-07-18: qty 2

## 2018-07-18 MED ORDER — MECLIZINE HCL 25 MG PO TABS: 25.0000 mg | ORAL_TABLET | Freq: Three times a day (TID) | ORAL | 0 refills | Status: DC | PRN

## 2018-07-18 MED ORDER — DEXAMETHASONE 4 MG PO TABS
10.0000 mg | ORAL_TABLET | Freq: Once | ORAL | Status: AC
  Administered 2018-07-18: 10 mg via ORAL
  Filled 2018-07-18: qty 3

## 2018-07-18 MED ORDER — AMOXICILLIN 500 MG PO CAPS: 1000.0000 mg | ORAL_CAPSULE | Freq: Two times a day (BID) | ORAL | 0 refills | Status: DC

## 2018-07-18 MED ORDER — AMOXICILLIN 500 MG PO CAPS
1000.0000 mg | ORAL_CAPSULE | Freq: Once | ORAL | Status: AC
  Administered 2018-07-18: 1000 mg via ORAL
  Filled 2018-07-18: qty 2

## 2018-07-18 MED ORDER — DIPHENHYDRAMINE HCL 50 MG/ML IJ SOLN
25.0000 mg | Freq: Once | INTRAMUSCULAR | Status: AC
  Administered 2018-07-18: 25 mg via INTRAMUSCULAR
  Filled 2018-07-18: qty 1

## 2018-07-18 NOTE — Telephone Encounter (Signed)
Pt c/o feeling "funny" stating she feels a lot of pressure in her head. Symptoms started Monday. Pt stated she feels pressure in her ears and to the back of her head. Pt stated yesterday she began having left arm, and left hand tingling, pt stated that the left arm feels heavier than the other. Checked for arm drift and pt stated that the left arm is weaker than the right.  Pt's BP yesterday was 176/110. This am pt checked her BP and it was 146/100. Pt stated that she feels generalized fatigue and weakness. Pt stated yesterday she was seeing bright spots, none today. Pt stated she did not sleep at all last night.  Pt takes HCTZ for her BP. Denies any missed doses.  Care advice given and pt advised to have someone drive her to the ED now.   Reason for Disposition . [8] Systolic BP  >= 299 OR Diastolic >= 371 AND [6] cardiac or neurologic symptoms (e.g., chest pain, difficulty breathing, unsteady gait, blurred vision)  Answer Assessment - Initial Assessment Questions 1. BLOOD PRESSURE: "What is the blood pressure?" "Did you take at least two measurements 5 minutes apart?"     146/100 2. ONSET: "When did you take your blood pressure?"     0600 3. HOW: "How did you obtain the blood pressure?" (e.g., visiting nurse, automatic home BP monitor)     Arm automatic BP machine 4. HISTORY: "Do you have a history of high blood pressure?"     yes 5. MEDICATIONS: "Are you taking any medications for blood pressure?" "Have you missed any doses recently?"     Yes HCTZ-no missed doses 6. OTHER SYMPTOMS: "Do you have any symptoms?" (e.g., headache, chest pain, blurred vision, difficulty breathing, weakness)     Headache, left arm tingling, weak. Left arm drift, yesterday bright spots, fatigue 7. PREGNANCY: "Is there any chance you are pregnant?" "When was your last menstrual period?"     n/a  Protocols used: HIGH BLOOD PRESSURE-A-AH

## 2018-07-18 NOTE — Telephone Encounter (Signed)
I spoke with pt and she was at [facility] ED now. FYI to Dr Darnell Level.

## 2018-07-18 NOTE — ED Notes (Signed)
ED Provider at bedside. 

## 2018-07-18 NOTE — ED Notes (Signed)
Results reviewed, no changes in acuity at this time 

## 2018-07-18 NOTE — ED Triage Notes (Signed)
Pt presents for evaluation of hypertension and chest pain last night with radiation to L arm and headaches. Reports recently had BP medication change.

## 2018-07-18 NOTE — Discharge Instructions (Signed)
Take tylenol 2 pills 4 times a day and motrin 4 pills 3 times a day.  Drink plenty of fluids.  Return for worsening shortness of breath, headache, confusion. Follow up with your family doctor.   

## 2018-07-18 NOTE — ED Provider Notes (Signed)
[facility] EMERGENCY DEPARTMENT Provider Note   CSN: [id number] Arrival date & time: 07/18/18  1024     History   Chief Complaint Chief Complaint  Patient presents with  . Hypertension  . Headache    HPI Amy [name] is a 47 y.o. female.  48 yo F with a cc of a headache.  Going on for the past week.  Denies cough, congestion.  Denies fevers, chills.  Headache comes and goes.  Is diffuse about the head.  Worse in bilateral ears.  She denies abdominal pain or vomiting.  Denies difficulty walking with speech or swallowing.  Patient does feel like she has tingling down her left arm.  She is most worried that she checked her blood pressure earlier today and was noted to be elevated.  She called her family doctor who sent her here for evaluation.  The history is provided by the patient.  Hypertension  This is a new problem. Associated symptoms include headaches. Pertinent negatives include no chest pain, no abdominal pain and no shortness of breath.  Headache   Pertinent negatives include no fever, no palpitations, no shortness of breath, no nausea and no vomiting.  Illness  This is a new problem. The current episode started more than 1 week ago. The problem occurs constantly. The problem has not changed since onset.Associated symptoms include headaches. Pertinent negatives include no chest pain, no abdominal pain and no shortness of breath. Nothing aggravates the symptoms. Nothing relieves the symptoms. She has tried nothing for the symptoms. The treatment provided no relief.    Past Medical History:  Diagnosis Date  . Diabetes mellitus without complication ([place])   . Fibromyalgia   . Hypertension   . IBS (irritable bowel syndrome)   . Missed abortion   . Premature ovarian failure 2014   not on HRT  . Vitamin D deficiency     Patient Active Problem List   Diagnosis Date Noted  . Urinary frequency 05/28/2018  . Skin rash 05/28/2018  . Head injury due to  trauma 05/12/2017  . Vitamin D deficiency   . Fibromyalgia   . Early menopause occurring in patient age younger than 57 years 11/30/2016  . Premature ovarian failure 10/15/2016  . Hemorrhoids 08/22/2009  . Lower abdominal pain 08/22/2009  . Irritable bowel syndrome (IBS) 04/14/2008  . Controlled type 2 diabetes mellitus without complication, without long-term current use of insulin ([place]) 04/14/2007  . ANXIETY 04/14/2007  . Essential hypertension 04/14/2007  . ALLERGIC RHINITIS 04/14/2007  . GERD 04/14/2007  . Osteopenia 04/14/2007  . HEADACHE 04/14/2007  . NEPHROLITHIASIS, HX OF 04/14/2007    Past Surgical History:  Procedure Laterality Date  . CESAREAN SECTION    . CHOLECYSTECTOMY  2004  . COLONOSCOPY  10/15/2008   WNL, IBS Carlean Purl)  . COLONOSCOPY  01/2018   proximal sigmoid colon inflammation biopsy negative ([place])  . ESOPHAGOGASTRODUODENOSCOPY  01/2018   chronic inactive gastritis, neg H pylori (Dr Penelope Coop)     OB History    Gravida  4   Para  3   Term      Preterm      AB  1   Living  3     SAB  1   TAB      Ectopic      Multiple      Live Births               Home Medications    Prior to Admission  medications   Medication Sig Start Date End Date Taking? Authorizing Provider  amoxicillin (AMOXIL) 500 MG capsule Take 2 capsules (1,000 mg total) by mouth 2 (two) times daily. 07/18/18   Deno Etienne, DO  Biotin 5 MG CAPS Take 1 capsule by mouth daily.    [provider]  ciprofloxacin (CIPRO) 250 MG tablet Take 1 tablet (250 mg total) by mouth 2 (two) times daily. 06/04/18   Huel Cote, NP  cyanocobalamin (V-R VITAMIN B-12) 500 MCG tablet Take 1 tablet (500 mcg total) by mouth daily. 05/10/17   Ria Bush, MD  glucose blood (RELION GLUCOSE TEST STRIPS) test strip Use as instructed 05/10/17   Ria Bush, MD  hydrochlorothiazide (HYDRODIURIL) 25 MG tablet Take 1 tablet (25 mg total) by mouth daily. 05/27/18   Ria Bush,  MD  meclizine (ANTIVERT) 25 MG tablet Take 1 tablet (25 mg total) by mouth 3 (three) times daily as needed for dizziness. 07/18/18   Deno Etienne, DO  metFORMIN (GLUCOPHAGE) 500 MG tablet Take 1 tablet (500 mg total) by mouth daily with breakfast. 05/31/18   Ria Bush, MD  omeprazole (PRILOSEC) 20 MG capsule Take 1 capsule (20 mg total) by mouth daily as needed (heartburn). 05/27/18   Ria Bush, MD  sulfamethoxazole-trimethoprim (BACTRIM DS) 800-160 MG tablet Take 1 tablet by mouth 2 (two) times daily. 05/28/18   Huel Cote, NP  triamcinolone cream (KENALOG) 0.1 % Apply 1 application topically 2 (two) times daily. Apply to [place]. 05/27/18 05/27/19  Ria Bush, MD    Family History Family History  Problem Relation Age of Onset  . Diabetes Mother   . Hypertension Mother   . Heart disease Mother   . Cancer Father        brain tumor  . Diabetes Paternal Grandfather   . Stroke Neg Hx     Social History Social History   Tobacco Use  . Smoking status: Never Smoker  . Smokeless tobacco: Never Used  Substance Use Topics  . Alcohol use: Yes    Comment: rare  . Drug use: No     Allergies   Patient has no known allergies.   Review of Systems Review of Systems  Constitutional: Negative for chills and fever.  HENT: Negative for congestion and rhinorrhea.   Eyes: Negative for redness and visual disturbance.  Respiratory: Negative for shortness of breath and wheezing.   Cardiovascular: Negative for chest pain and palpitations.  Gastrointestinal: Negative for abdominal pain, nausea and vomiting.  Genitourinary: Negative for dysuria and urgency.  Musculoskeletal: Negative for arthralgias and myalgias.  Skin: Negative for pallor and wound.  Neurological: Positive for weakness (tingling to LUE) and headaches. Negative for dizziness.     Physical Exam Updated Vital Signs BP 120/75   Pulse 61   Temp 98.4 F (36.9 C) (Oral)   Resp (!) 22   LMP 10/06/2014   SpO2  98%   Physical Exam  Constitutional: She is oriented to person, place, and time. She appears well-developed and well-nourished. No distress.  HENT:  Head: Normocephalic and atraumatic.  Swollen turbinates, posterior nasal drip, no noted sinus ttp, TMs with bilateral effusions and some bulging worse on the left than the right.   Eyes: Pupils are equal, round, and reactive to light. EOM are normal.  Neck: Normal range of motion. Neck supple.  Cardiovascular: Normal rate and regular rhythm. Exam reveals no gallop and no friction rub.  No murmur heard. Pulmonary/Chest: Effort normal. She has no wheezes. She  has no rales.  Abdominal: Soft. She exhibits no distension. There is no tenderness.  Musculoskeletal: She exhibits no edema or tenderness.  Pulse motor and sensation is intact to left upper extremity.  Her sensation of paresthesias is reproduced with palpation of the left trapezius.  Neurological: She is alert and oriented to person, place, and time. She has normal strength. No cranial nerve deficit or sensory deficit. She displays a negative Romberg sign. Coordination and gait normal.  Benign neurologic exam.  Skin: Skin is warm and dry. She is not diaphoretic.  Psychiatric: She has a normal mood and affect. Her behavior is normal.  Nursing note and vitals reviewed.    ED Treatments / Results  Labs (all labs ordered are listed, but only abnormal results are displayed) Labs Reviewed  BASIC METABOLIC PANEL  CBC  I-STAT TROPONIN, ED  I-STAT BETA HCG BLOOD, ED ([facility], [facility], [facility] ONLY)    EKG EKG Interpretation  Date/Time:  Friday July 18 2018 10:49:58 EDT Ventricular Rate:  56 PR Interval:  138 QRS Duration: 88 QT Interval:  430 QTC Calculation: 414 R Axis:   84 Text Interpretation:  Sinus bradycardia with sinus arrhythmia Nonspecific T wave abnormality Abnormal ECG t wave in I, II, and III with likely background noise Otherwise no significant change Confirmed by Deno Etienne  [phone]) on 07/18/2018 3:51:59 PM   Radiology Dg Chest 2 View  Result Date: 07/18/2018 CLINICAL DATA:  Chest pain. EXAM: CHEST - 2 VIEW COMPARISON:  Radiograph of February 10, 2008. FINDINGS: The heart size and mediastinal contours are within normal limits. Both lungs are clear. No pneumothorax or pleural effusion is noted. The visualized skeletal structures are unremarkable. IMPRESSION: No active cardiopulmonary disease. Electronically Signed   By: Marijo Conception, M.D.   On: 07/18/2018 11:32    Procedures Procedures (including critical care time)  Medications Ordered in ED Medications  prochlorperazine (COMPAZINE) injection 10 mg (10 mg Intramuscular Given 07/18/18 1725)  diphenhydrAMINE (BENADRYL) injection 25 mg (25 mg Intramuscular Given 07/18/18 1724)  dexamethasone (DECADRON) tablet 10 mg (10 mg Oral Given 07/18/18 1723)  amoxicillin (AMOXIL) capsule 1,000 mg (1,000 mg Oral Given 07/18/18 1722)     Initial Impression / Assessment and Plan / ED Course  I have reviewed the triage vital signs and the nursing notes.  Pertinent labs & imaging results that were available during my care of the patient were reviewed by me and considered in my medical decision making (see chart for details).     47 yo F with a chief complaint of a headache.  This is mostly in her ears been going on for the past week.  Clinically she has otitis media bilaterally.  She has a benign neurologic exam.  She is complaining of some paresthesias to the left upper extremity was for reproduced with palpation of the left trapezius muscle.  I suspect she has a muscular strain there.  We will have her do Tylenol and ibuprofen.  Was given a headache cocktail with significant improvement of her symptoms.  Discharge home with family medicine follow-up.  The patient had a laboratory evaluation done in triage.  She is not pregnant and her troponin is negative her BMP is unremarkable.  I feel this is completely atypical of ACS and do  not feel that further work-up is required.  Chest x-ray reviewed by me without focal infiltrate.  She was not noted to be hypertensive here.  Her last blood pressure before she left was normotensive I suspect  that if there was any elevation it was secondary to her pain.  6:23 PM:  I have discussed the diagnosis/risks/treatment options with the patient and family and believe the pt to be eligible for discharge home to follow-up with PCP. We also discussed returning to the ED immediately if new or worsening sx occur. We discussed the sx which are most concerning (e.g., sudden worsening pain, fever, inability to tolerate by mouth) that necessitate immediate return. Medications administered to the patient during their visit and any new prescriptions provided to the patient are listed below.  Medications given during this visit Medications  prochlorperazine (COMPAZINE) injection 10 mg (10 mg Intramuscular Given 07/18/18 1725)  diphenhydrAMINE (BENADRYL) injection 25 mg (25 mg Intramuscular Given 07/18/18 1724)  dexamethasone (DECADRON) tablet 10 mg (10 mg Oral Given 07/18/18 1723)  amoxicillin (AMOXIL) capsule 1,000 mg (1,000 mg Oral Given 07/18/18 1722)      The patient appears reasonably screen and/or stabilized for discharge and I doubt any other medical condition or other [facility] requiring further screening, evaluation, or treatment in the ED at this time prior to discharge.    Final Clinical Impressions(s) / ED Diagnoses   Final diagnoses:  Viral upper respiratory tract infection  Acute suppurative otitis media of both ears without spontaneous rupture of tympanic membranes, recurrence not specified    ED Discharge Orders         Ordered    amoxicillin (AMOXIL) 500 MG capsule  2 times daily     07/18/18 1808    meclizine (ANTIVERT) 25 MG tablet  3 times daily PRN     07/18/18 1809           Deno Etienne, DO 07/18/18 1823

## 2018-09-10 ENCOUNTER — Encounter: Payer: Self-pay | Admitting: Women's Health

## 2018-09-30 LAB — HM DIABETES EYE EXAM

## 2018-10-03 ENCOUNTER — Encounter: Payer: Self-pay | Admitting: Family Medicine

## 2019-03-26 ENCOUNTER — Other Ambulatory Visit: Payer: Self-pay

## 2019-03-30 ENCOUNTER — Other Ambulatory Visit: Payer: Self-pay

## 2019-03-30 ENCOUNTER — Encounter: Payer: Self-pay | Admitting: Women's Health

## 2019-03-30 ENCOUNTER — Ambulatory Visit (INDEPENDENT_AMBULATORY_CARE_PROVIDER_SITE_OTHER): Payer: Self-pay | Admitting: Women's Health

## 2019-03-30 VITALS — BP 140/84 | Ht 64.0 in | Wt 172.0 lb

## 2019-03-30 DIAGNOSIS — R300 Dysuria: Secondary | ICD-10-CM

## 2019-03-30 DIAGNOSIS — Z124 Encounter for screening for malignant neoplasm of cervix: Secondary | ICD-10-CM

## 2019-03-30 MED ORDER — FLUCONAZOLE 150 MG PO TABS: 150.0000 mg | ORAL_TABLET | Freq: Once | ORAL | 1 refills | Status: AC

## 2019-03-30 NOTE — Progress Notes (Signed)
Amy [name] 10-29-1970 [id number]    History:    Presents for annual exam.  Premature menopause at age 48, no bleeding or HRT.  Normal Pap and mammogram history.  Overdue for mammogram.  Primary care managing diabetes, hypertension.  Has lost 25 pounds in the past year with diet and exercise.  Past medical history, past surgical history, family history and social history were all reviewed and documented in the EPIC chart.  Works in a factory.  3 children all doing well and has several grandchildren.  ROS:  A ROS was performed and pertinent positives and negatives are included.  Exam:  Vitals:   03/30/19 1436  BP: 140/84  Weight: 172 lb (78 kg)  Height: 5\' 4"  (1.626 m)   Body mass index is 29.52 kg/m.   General appearance:  Normal Thyroid:  Symmetrical, normal in size, without palpable masses or nodularity. Respiratory  Auscultation:  Clear without wheezing or rhonchi Cardiovascular  Auscultation:  Regular rate, without rubs, murmurs or gallops  Edema/varicosities:  Not grossly evident Abdominal  Soft,nontender, without masses, guarding or rebound.  Liver/spleen:  No organomegaly noted  Hernia:  None appreciated  Skin  Inspection:  Grossly normal   Breasts: Examined lying and sitting.     Right: Without masses, retractions, discharge or axillary adenopathy.     Left: Without masses, retractions, discharge or axillary adenopathy. Gentitourinary   Inguinal/mons:  Normal without inguinal adenopathy  External genitalia:  Normal  BUS/Urethra/Skene's glands:  Normal  Vagina: Erythemic visible discharge  Cervix:  Normal  Uterus:  normal in size, shape and contour.  Midline and mobile  Adnexa/parametria:     Rt: Without masses or tenderness.   Lt: Without masses or tenderness.  Anus and perineum: Normal  Digital rectal exam: Normal sphincter tone without palpated masses or tenderness  UA: Negative nitrites, negative leukocytes, no WBCs, no RBCs, no  bacteria  Assessment/Plan:  48 y.o. M HF G4 P3 for annual exam with complaint of increased urinary frequency.  Premature menopause on no HRT with no bleeding Diabetes, hypertension, IBS-primary care manages labs and meds Probable yeast vaginitis  Plan: Reviewed normality of UA.  Diflucan 150 p.o. x1 dose, yeast prevention discussed, instructed to call if no relief of symptoms.  Encouraged OTC vaginal lubricants with intercourse.  SBEs, annual screening mammogram without insurance breast center scholarship information given for free mammogram.  Encouraged weightbearing and balance type exercise, calcium rich foods, vitamin D 1000 daily .  Congratulated on weight loss and healthier lifestyle.  Pap, history of normal Paps will repeat every 3 years.  Huel [facility], 2:57 PM 03/30/2019

## 2019-03-30 NOTE — Patient Instructions (Addendum)
Use OTC vag lubricant  gardasil ?? Amy [name]  Atrophic Vaginitis Atrophic vaginitis is a condition in which the tissues that line the vagina become dry and thin. This condition occurs in women who have stopped having their period. It is caused by a drop in a female hormone (estrogen). This hormone helps:  To keep the vagina moist.  To make a clear fluid. This clear fluid helps: ? To make the vagina ready for sex. ? To protect the vagina from infection. If the lining of the vagina is dry and thin, it may cause irritation, burning, or itchiness. It may also:  Make sex painful.  Make an exam of your vagina painful.  Cause bleeding.  Make you lose interest in sex.  Cause a burning feeling when you pee (urinate).  Cause a brown or yellow fluid to come from your vagina. Some women do not have symptoms. Follow these instructions at home: Medicines  Take over-the-counter and prescription medicines only as told by your doctor.  Do not use herbs or other medicines unless your doctor says it is okay.  Use medicines for for dryness. These include: ? Oils to make the vagina soft. ? Creams. ? Moisturizers. General instructions  Do not douche.  Do not use products that can make your vagina dry. These include: ? Scented sprays. ? Scented tampons. ? Scented soaps.  Sex can help increase blood flow and soften the tissue in the vagina. If it hurts to have sex: ? Tell your partner. ? Use products to make sex more comfortable. Use these only as told by your doctor. Contact a doctor if you:  Have discharge from the vagina that is different than usual.  Have a bad smell coming from your vagina.  Have new symptoms.  Do not get better.  Get worse. Summary  Atrophic vaginitis is a condition in which the lining of the vagina becomes dry and thin.  This condition affects women who have stopped having their periods.  Treatment may include using products that help make the vagina  soft.  Call a doctor if do not get better with treatment. This information is not intended to replace advice given to you by your health care provider. Make sure you discuss any questions you have with your health care provider. Document Released: 03/19/2008 Document Revised: 10/14/2017 Document Reviewed: 10/14/2017 Elsevier Interactive Patient Education  2019 [place] Maintenance for Postmenopausal Women Menopause is a normal process in which your reproductive ability comes to an end. This process happens gradually over a span of months to years, usually between the ages of 29 and 69. Menopause is complete when you have missed 12 consecutive menstrual periods. It is important to talk with your health care provider about some of the most common conditions that affect postmenopausal women, such as heart disease, cancer, and bone loss (osteoporosis). Adopting a healthy lifestyle and getting preventive care can help to promote your health and wellness. Those actions can also lower your chances of developing some of these common conditions. What should I know about menopause? During menopause, you may experience a number of symptoms, such as:  Moderate-to-severe hot flashes.  Night sweats.  Decrease in sex drive.  Mood swings.  Headaches.  Tiredness.  Irritability.  Memory problems.  Insomnia. Choosing to treat or not to treat menopausal changes is an individual decision that you make with your health care provider. What should I know about hormone replacement therapy and supplements? Hormone therapy products are effective for treating symptoms  that are associated with menopause, such as hot flashes and night sweats. Hormone replacement carries certain risks, especially as you become older. If you are thinking about using estrogen or estrogen with progestin treatments, discuss the benefits and risks with your health care provider. What should I know about heart disease and  stroke? Heart disease, heart attack, and stroke become more likely as you age. This may be due, in part, to the hormonal changes that your body experiences during menopause. These can affect how your body processes dietary fats, triglycerides, and cholesterol. Heart attack and stroke are both medical emergencies. There are many things that you can do to help prevent heart disease and stroke:  Have your blood pressure checked at least every 1-2 years. High blood pressure causes heart disease and increases the risk of stroke.  If you are 81-47 years old, ask your health care provider if you should take aspirin to prevent a heart attack or a stroke.  Do not use any tobacco products, including cigarettes, chewing tobacco, or electronic cigarettes. If you need help quitting, ask your health care provider.  It is important to eat a healthy diet and maintain a healthy weight. ? Be sure to include plenty of vegetables, fruits, low-fat dairy products, and lean protein. ? Avoid eating foods that are high in solid fats, added sugars, or salt (sodium).  Get regular exercise. This is one of the most important things that you can do for your health. ? Try to exercise for at least 150 minutes each week. The type of exercise that you do should increase your heart rate and make you sweat. This is known as moderate-intensity exercise. ? Try to do strengthening exercises at least twice each week. Do these in addition to the moderate-intensity exercise.  Know your numbers.Ask your health care provider to check your cholesterol and your blood glucose. Continue to have your blood tested as directed by your health care provider.  What should I know about cancer screening? There are several types of cancer. Take the following steps to reduce your risk and to catch any cancer development as early as possible. Breast Cancer  Practice breast self-awareness. ? This means understanding how your breasts normally appear  and feel. ? It also means doing regular breast self-exams. Let your health care provider know about any changes, no matter how small.  If you are 64 or older, have a clinician do a breast exam (clinical breast exam or CBE) every year. Depending on your age, family history, and medical history, it may be recommended that you also have a yearly breast X-ray (mammogram).  If you have a family history of breast cancer, talk with your health care provider about genetic screening.  If you are at high risk for breast cancer, talk with your health care provider about having an MRI and a mammogram every year.  Breast cancer (BRCA) gene test is recommended for women who have family members with BRCA-related cancers. Results of the assessment will determine the need for genetic counseling and BRCA1 and for BRCA2 testing. BRCA-related cancers include these types: ? Breast. This occurs in males or females. ? Ovarian. ? Tubal. This may also be called fallopian tube cancer. ? Cancer of the abdominal or pelvic lining (peritoneal cancer). ? Prostate. ? Pancreatic. Cervical, Uterine, and Ovarian Cancer Your health care provider may recommend that you be screened regularly for cancer of the pelvic organs. These include your ovaries, uterus, and vagina. This screening involves a pelvic exam, which  includes checking for microscopic changes to the surface of your cervix (Pap test).  For women ages 21-65, health care providers may recommend a pelvic exam and a Pap test every three years. For women ages 40-65, they may recommend the Pap test and pelvic exam, combined with testing for human papilloma virus (HPV), every five years. Some types of HPV increase your risk of cervical cancer. Testing for HPV may also be done on women of any age who have unclear Pap test results.  Other health care providers may not recommend any screening for nonpregnant women who are considered low risk for pelvic cancer and have no  symptoms. Ask your health care provider if a screening pelvic exam is right for you.  If you have had past treatment for cervical cancer or a condition that could lead to cancer, you need Pap tests and screening for cancer for at least 20 years after your treatment. If Pap tests have been discontinued for you, your risk factors (such as having a new sexual partner) need to be reassessed to determine if you should start having screenings again. Some women have medical problems that increase the chance of getting cervical cancer. In these cases, your health care provider may recommend that you have screening and Pap tests more often.  If you have a family history of uterine cancer or ovarian cancer, talk with your health care provider about genetic screening.  If you have vaginal bleeding after reaching menopause, tell your health care provider.  There are currently no reliable tests available to screen for ovarian cancer. Lung Cancer Lung cancer screening is recommended for adults 1-43 years old who are at high risk for lung cancer because of a history of smoking. A yearly low-dose CT scan of the lungs is recommended if you:  Currently smoke.  Have a history of at least 30 pack-years of smoking and you currently smoke or have quit within the past 15 years. A pack-year is smoking an average of one pack of cigarettes per day for one year. Yearly screening should:  Continue until it has been 15 years since you quit.  Stop if you develop a health problem that would prevent you from having lung cancer treatment. Colorectal Cancer  This type of cancer can be detected and can often be prevented.  Routine colorectal cancer screening usually begins at age 71 and continues through age 67.  If you have risk factors for colon cancer, your health care provider may recommend that you be screened at an earlier age.  If you have a family history of colorectal cancer, talk with your health care provider  about genetic screening.  Your health care provider may also recommend using home test kits to check for hidden blood in your stool.  A small camera at the end of a tube can be used to examine your colon directly (sigmoidoscopy or colonoscopy). This is done to check for the earliest forms of colorectal cancer.  Direct examination of the colon should be repeated every 5-10 years until age 51. However, if early forms of precancerous polyps or small growths are found or if you have a family history or genetic risk for colorectal cancer, you may need to be screened more often. Skin Cancer  Check your skin from head to toe regularly.  Monitor any moles. Be sure to tell your health care provider: ? About any new moles or changes in moles, especially if there is a change in a mole's shape or color. ?  If you have a mole that is larger than the size of a pencil eraser.  If any of your family members has a history of skin cancer, especially at a Prudie Guthridge age, talk with your health care provider about genetic screening.  Always use sunscreen. Apply sunscreen liberally and repeatedly throughout the day.  Whenever you are outside, protect yourself by wearing long sleeves, pants, a wide-brimmed hat, and sunglasses. What should I know about osteoporosis? Osteoporosis is a condition in which bone destruction happens more quickly than new bone creation. After menopause, you may be at an increased risk for osteoporosis. To help prevent osteoporosis or the bone fractures that can happen because of osteoporosis, the following is recommended:  If you are 63-33 years old, get at least 1,000 mg of calcium and at least 600 mg of vitamin D per day.  If you are older than age 36 but younger than age 31, get at least 1,200 mg of calcium and at least 600 mg of vitamin D per day.  If you are older than age 50, get at least 1,200 mg of calcium and at least 800 mg of vitamin D per day. Smoking and excessive alcohol intake  increase the risk of osteoporosis. Eat foods that are rich in calcium and vitamin D, and do weight-bearing exercises several times each week as directed by your health care provider. What should I know about how menopause affects my mental health? Depression may occur at any age, but it is more common as you become older. Common symptoms of depression include:  Low or sad mood.  Changes in sleep patterns.  Changes in appetite or eating patterns.  Feeling an overall lack of motivation or enjoyment of activities that you previously enjoyed.  Frequent crying spells. Talk with your health care provider if you think that you are experiencing depression. What should I know about immunizations? It is important that you get and maintain your immunizations. These include:  Tetanus, diphtheria, and pertussis (Tdap) booster vaccine.  Influenza every year before the flu season begins.  Pneumonia vaccine.  Shingles vaccine. Your health care provider may also recommend other immunizations. This information is not intended to replace advice given to you by your health care provider. Make sure you discuss any questions you have with your health care provider. Document Released: 11/23/2005 Document Revised: 04/20/2016 Document Reviewed: 07/05/2015 Elsevier Interactive Patient Education  2019 [employer].

## 2019-03-31 LAB — PAP IG W/ RFLX HPV ASCU

## 2019-03-31 NOTE — Addendum Note (Signed)
Addended by: Lorine Bears on: 03/31/2019 10:52 AM   Modules accepted: Orders

## 2019-04-01 LAB — URINALYSIS, COMPLETE W/RFL CULTURE
Bacteria, UA: NONE SEEN /HPF
Bilirubin Urine: NEGATIVE
Glucose, UA: NEGATIVE
Hgb urine dipstick: NEGATIVE
Hyaline Cast: NONE SEEN /LPF
Ketones, ur: NEGATIVE
Leukocyte Esterase: NEGATIVE
Nitrites, Initial: NEGATIVE
Protein, ur: NEGATIVE
RBC / HPF: NONE SEEN /HPF (ref 0–2)
Specific Gravity, Urine: 1.02 (ref 1.001–1.03)
WBC, UA: NONE SEEN /HPF (ref 0–5)
pH: 8.5 — ABNORMAL HIGH (ref 5.0–8.0)

## 2019-04-01 LAB — NO CULTURE INDICATED

## 2019-06-02 ENCOUNTER — Telehealth: Payer: Self-pay | Admitting: Family Medicine

## 2019-06-03 NOTE — Telephone Encounter (Signed)
E-scribed refills.  Plz schedule CPE or HTN f/u and labs

## 2019-06-04 NOTE — Telephone Encounter (Signed)
Patient called back and scheduled her CPE

## 2019-06-04 NOTE — Telephone Encounter (Signed)
Noted  

## 2019-06-04 NOTE — Telephone Encounter (Signed)
lvm for pt to call back and schedule cpe/HTN follow up and labs

## 2019-06-04 NOTE — Telephone Encounter (Signed)
Pt is scheduled 08/20/19 @ 9:30 for CPE and labs

## 2019-08-20 ENCOUNTER — Other Ambulatory Visit: Payer: Self-pay

## 2019-08-20 ENCOUNTER — Ambulatory Visit (INDEPENDENT_AMBULATORY_CARE_PROVIDER_SITE_OTHER): Payer: Self-pay | Admitting: Family Medicine

## 2019-08-20 ENCOUNTER — Encounter: Payer: Self-pay | Admitting: Family Medicine

## 2019-08-20 VITALS — BP 140/90 | HR 76 | Temp 97.8°F | Ht 64.0 in | Wt 170.2 lb

## 2019-08-20 DIAGNOSIS — R109 Unspecified abdominal pain: Secondary | ICD-10-CM

## 2019-08-20 DIAGNOSIS — I10 Essential (primary) hypertension: Secondary | ICD-10-CM

## 2019-08-20 DIAGNOSIS — K582 Mixed irritable bowel syndrome: Secondary | ICD-10-CM

## 2019-08-20 DIAGNOSIS — F332 Major depressive disorder, recurrent severe without psychotic features: Secondary | ICD-10-CM

## 2019-08-20 DIAGNOSIS — E119 Type 2 diabetes mellitus without complications: Secondary | ICD-10-CM

## 2019-08-20 LAB — COMPREHENSIVE METABOLIC PANEL
ALT: 14 U/L (ref 0–35)
AST: 18 U/L (ref 0–37)
Albumin: 4.6 g/dL (ref 3.5–5.2)
Alkaline Phosphatase: 77 U/L (ref 39–117)
BUN: 14 mg/dL (ref 6–23)
CO2: 31 mEq/L (ref 19–32)
Calcium: 9.4 mg/dL (ref 8.4–10.5)
Chloride: 104 mEq/L (ref 96–112)
Creatinine, Ser: 0.63 mg/dL (ref 0.40–1.20)
GFR: 101.36 mL/min (ref >60.00)
Glucose, Bld: 100 mg/dL — ABNORMAL HIGH (ref 70–99)
Potassium: 3.8 mEq/L (ref 3.5–5.1)
Sodium: 141 mEq/L (ref 135–145)
Total Bilirubin: 0.4 mg/dL (ref 0.2–1.2)
Total Protein: 7.7 g/dL (ref 6.0–8.3)

## 2019-08-20 LAB — POC URINALSYSI DIPSTICK (AUTOMATED)
Bilirubin, UA: NEGATIVE
Blood, UA: NEGATIVE
Glucose, UA: NEGATIVE
Ketones, UA: NEGATIVE
Nitrite, UA: NEGATIVE
Protein, UA: NEGATIVE
Spec Grav, UA: >=1.03 — AB (ref 1.010–1.025)
Urobilinogen, UA: 0.2 E.U./dL
pH, UA: 6 (ref 5.0–8.0)

## 2019-08-20 LAB — POCT GLYCOSYLATED HEMOGLOBIN (HGB A1C): Hemoglobin A1C: 5.8 % — AB (ref 4.0–5.6)

## 2019-08-20 LAB — CBC WITH DIFFERENTIAL/PLATELET
Basophils Absolute: 0.1 10*3/uL (ref 0.0–0.1)
Basophils Relative: 1.2 % (ref 0.0–3.0)
Eosinophils Absolute: 0.2 10*3/uL (ref 0.0–0.7)
Eosinophils Relative: 2.7 % (ref 0.0–5.0)
HCT: 43.3 % (ref 36.0–46.0)
Hemoglobin: 14.5 g/dL (ref 12.0–15.0)
Lymphocytes Relative: 33.7 % (ref 12.0–46.0)
Lymphs Abs: 2.5 10*3/uL (ref 0.7–4.0)
MCHC: 33.6 g/dL (ref 30.0–36.0)
MCV: 92.5 fl (ref 78.0–100.0)
Monocytes Absolute: 0.5 10*3/uL (ref 0.1–1.0)
Monocytes Relative: 6.7 % (ref 3.0–12.0)
Neutro Abs: 4.2 10*3/uL (ref 1.4–7.7)
Neutrophils Relative %: 55.7 % (ref 43.0–77.0)
Platelets: 271 10*3/uL (ref 150.0–400.0)
RBC: 4.69 Mil/uL (ref 3.87–5.11)
RDW: 12.8 % (ref 11.5–15.5)
WBC: 7.5 10*3/uL (ref 4.0–10.5)

## 2019-08-20 LAB — TSH: TSH: 2.71 u[IU]/mL (ref 0.35–4.50)

## 2019-08-20 LAB — LIPASE: Lipase: 31 U/L (ref 11.0–59.0)

## 2019-08-20 MED ORDER — SERTRALINE HCL 25 MG PO TABS: 25.0000 mg | ORAL_TABLET | Freq: Every day | ORAL | 6 refills | Status: DC

## 2019-08-20 MED ORDER — LOSARTAN POTASSIUM-HCTZ 50-12.5 MG PO TABS: 1.0000 | ORAL_TABLET | Freq: Every day | ORAL | 3 refills | Status: DC

## 2019-08-20 MED ORDER — OMEPRAZOLE 20 MG PO CPDR: 20.0000 mg | DELAYED_RELEASE_CAPSULE | Freq: Every day | ORAL | 6 refills | Status: DC | PRN

## 2019-08-20 MED ORDER — METFORMIN HCL 500 MG PO TABS: 500.0000 mg | ORAL_TABLET | Freq: Every day | ORAL | 3 refills | Status: DC

## 2019-08-20 MED ORDER — CIPROFLOXACIN HCL 500 MG PO TABS: 500.0000 mg | ORAL_TABLET | Freq: Two times a day (BID) | ORAL | 0 refills | Status: DC

## 2019-08-20 NOTE — Progress Notes (Signed)
This visit was conducted in person.  BP 140/90 (BP Location: Left Arm, Patient Position: Sitting, Cuff Size: Normal)   Pulse 76   Temp 97.8 F (36.6 C) (Temporal)   Ht 5\' 4"  (1.626 m)   Wt 170 lb 3 oz (77.2 kg)   LMP 10/06/2014   SpO2 98%   BMI 29.21 kg/m   BP Readings from Last 3 Encounters:  08/20/19 140/90  03/30/19 140/84  07/18/18 120/75    CC: CPE - converted to acute visit Subjective:    Patient ID: Amy [name], female    DOB: Oct 17, 1970, 48 y.o.   MRN: [id number]  HPI: Amy [name] is a 48 y.o. female presenting on 08/20/2019 for Annual Exam (Wants to discuss HCTZ. )   Self pay.  Last seen 05/2018.  She had lost significant weight last year. Weight gain returning - attributes to increased stress eating. She does intermittent fasting. Noticing worsening anxiety over the past year affecting worsening IBS. She has been drinking water infused with boiled celery and parsley.   Endorses 3 wk h/o worsening LLQ abd pain with radiation to L back described as burning pain associated with chills. Notes occasional abdominal swelling/distension associated with bloating/belching. Symptoms worsen with fried greasy foods. No fevers, vomiting. H/o IBS - alternating diarrhea/constipation - but notes worse loose stools. Some nausea yesterday. Some dysuria and mild urgency, without increased frequency or hematuria.   HTN - Compliant with current antihypertensive regimen of hctz 25mg  daily.  Losartan previously stopped after weight loss. Does check blood pressures at home: [id number] systolic. No low blood pressure readings or symptoms of dizziness/syncope. Denies vision changes, CP/tightness, SOB, leg swelling. Occasional HA.   DM - improved control with weight loss.   Screening positive for depression/anxiety. Endorses SI. Endorses longstanding history of depression and SI at age 69 yo, 3 times total. Significant work, family stressors, marital discord. Finds prayer and meditation are  helpful.   Preventative: COLONOSCOPY 01/2018 - proximal sigmoid colon inflammation biopsy negative ([facility]).  Well woman with OBGYN 03/2019 normal pap rpt 3 yrs Elon Alas at [facility]) Flu shot - declines  Spanish speaking From [place] Lives with husband, mother, son (2000) and daughter (2003) Occ: works at [employer]     Relevant past medical, surgical, family and social history reviewed and updated as indicated. Interim medical history since our last visit reviewed. Allergies and medications reviewed and updated. Outpatient Medications Prior to Visit  Medication Sig Dispense Refill  . Biotin 5 MG CAPS Take 1 capsule by mouth daily.    . cyanocobalamin (V-R VITAMIN B-12) 500 MCG tablet Take 1 tablet (500 mcg total) by mouth daily.    Marland Kitchen glucose blood (RELION GLUCOSE TEST STRIPS) test strip Use as instructed 100 each 12  . hydrochlorothiazide (HYDRODIURIL) 25 MG tablet Take 1 tablet by mouth once daily 90 tablet 0  . metFORMIN (GLUCOPHAGE) 500 MG tablet Take 1 tablet (500 mg total) by mouth daily with breakfast.    . omeprazole (PRILOSEC) 20 MG capsule Take 1 capsule (20 mg total) by mouth daily as needed (heartburn). 30 capsule 3  . losartan (COZAAR) 100 MG tablet Take 100 mg by mouth daily.     No facility-administered medications prior to visit.      Per HPI unless specifically indicated in ROS section below Review of Systems Objective:    BP 140/90 (BP Location: Left Arm, Patient Position: Sitting, Cuff Size: Normal)   Pulse 76   Temp  97.8 F (36.6 C) (Temporal)   Ht 5\' 4"  (1.626 m)   Wt 170 lb 3 oz (77.2 kg)   LMP 10/06/2014   SpO2 98%   BMI 29.21 kg/m   Wt Readings from Last 3 Encounters:  08/20/19 170 lb 3 oz (77.2 kg)  03/30/19 172 lb (78 kg)  05/27/18 168 lb 8 oz (76.4 kg)    Physical Exam Vitals signs and nursing note reviewed.  Constitutional:      General: She is not in acute distress.    Appearance: Normal appearance. She is not  ill-appearing.  HENT:     Head: Normocephalic and atraumatic.     Mouth/Throat:     Mouth: Mucous membranes are moist.     Pharynx: Oropharynx is clear. No posterior oropharyngeal erythema.  Eyes:     General: No scleral icterus.    Extraocular Movements: Extraocular movements intact.     Conjunctiva/sclera: Conjunctivae normal.     Pupils: Pupils are equal, round, and reactive to light.  Neck:     Thyroid: No thyroid mass or thyromegaly.  Cardiovascular:     Rate and Rhythm: Normal rate and regular rhythm.     Pulses: Normal pulses.     Heart sounds: Normal heart sounds. No murmur.  Pulmonary:     Effort: Pulmonary effort is normal. No respiratory distress.     Breath sounds: Normal breath sounds. No wheezing, rhonchi or rales.  Abdominal:     General: Abdomen is flat. Bowel sounds are normal. There is no distension.     Palpations: Abdomen is soft. There is no mass.     Tenderness: There is abdominal tenderness (moderate) in the epigastric area, periumbilical area and left upper quadrant. There is left CVA tenderness. There is no right CVA tenderness, guarding or rebound.     Hernia: No hernia is present.  Musculoskeletal:     Right lower leg: No edema.     Left lower leg: No edema.  Skin:    General: Skin is warm and dry.     Findings: No rash.  Neurological:     Mental Status: She is alert.  Psychiatric:        Attention and Perception: Attention normal.        Mood and Affect: Mood normal.        Speech: Speech normal.        Behavior: Behavior normal.        Thought Content: Thought content normal.     Comments: Tearful with discussion of stressors       Results for orders placed or performed in visit on 08/20/19  Comprehensive metabolic panel  Result Value Ref Range   Sodium 141 135 - 145 mEq/L   Potassium 3.8 3.5 - 5.1 mEq/L   Chloride 104 96 - 112 mEq/L   CO2 31 19 - 32 mEq/L   Glucose, Bld 100 (H) 70 - 99 mg/dL   BUN 14 6 - 23 mg/dL   Creatinine, Ser 0.63  0.40 - 1.20 mg/dL   Total Bilirubin 0.4 0.2 - 1.2 mg/dL   Alkaline Phosphatase 77 39 - 117 U/L   AST 18 0 - 37 U/L   ALT 14 0 - 35 U/L   Total Protein 7.7 6.0 - 8.3 g/dL   Albumin 4.6 3.5 - 5.2 g/dL   GFR 101.36 >60.00 mL/min   Calcium 9.4 8.4 - 10.5 mg/dL  TSH  Result Value Ref Range   TSH 2.71 0.35 - 4.50  uIU/mL  CBC with Differential  Result Value Ref Range   WBC 7.5 4.0 - 10.5 K/uL   RBC 4.69 3.87 - 5.11 Mil/uL   Hemoglobin 14.5 12.0 - 15.0 g/dL   HCT 43.3 36.0 - 46.0 %   MCV 92.5 78.0 - 100.0 fl   MCHC 33.6 30.0 - 36.0 g/dL   RDW 12.8 11.5 - 15.5 %   Platelets 271.0 150.0 - 400.0 K/uL   Neutrophils Relative % 55.7 43.0 - 77.0 %   Lymphocytes Relative 33.7 12.0 - 46.0 %   Monocytes Relative 6.7 3.0 - 12.0 %   Eosinophils Relative 2.7 0.0 - 5.0 %   Basophils Relative 1.2 0.0 - 3.0 %   Neutro Abs 4.2 1.4 - 7.7 K/uL   Lymphs Abs 2.5 0.7 - 4.0 K/uL   Monocytes Absolute 0.5 0.1 - 1.0 K/uL   Eosinophils Absolute 0.2 0.0 - 0.7 K/uL   Basophils Absolute 0.1 0.0 - 0.1 K/uL  Lipase  Result Value Ref Range   Lipase 31.0 11.0 - 59.0 U/L  POCT glycosylated hemoglobin (Hb A1C)  Result Value Ref Range   Hemoglobin A1C 5.8 (A) 4.0 - 5.6 %   HbA1c POC (<> result, manual entry)     HbA1c, POC (prediabetic range)     HbA1c, POC (controlled diabetic range)    POCT Urinalysis Dipstick (Automated)  Result Value Ref Range   Color, UA yellow    Clarity, UA clear    Glucose, UA Negative Negative   Bilirubin, UA negative    Ketones, UA negative    Spec Grav, UA >=1.030 (A) 1.010 - 1.025   Blood, UA negative    pH, UA 6.0 5.0 - 8.0   Protein, UA Negative Negative   Urobilinogen, UA 0.2 0.2 or 1.0 E.U./dL   Nitrite, UA negative    Leukocytes, UA Small (1+) (A) Negative   Assessment & Plan:   Problem List Items Addressed This Visit    MDD (major depressive disorder), recurrent severe, without psychosis ([place])    Endorses longstanding issue with depression with SI and suicide  attempts as teenager, currently contracts for safety. Did recommend starting medication - will start sertraline 25mg  daily. Reviewed increased suicidality risk of antidepressants. Suicide prevention handout provided today. Chronic issue, has not seeked treatment, difficulty affording counseling. I will ask our care coordinator to investigate affordable counseling options. RTC 3 mo f/u visit. Pt agrees.       Relevant Medications   sertraline (ZOLOFT) 25 MG tablet   Left sided abdominal pain - Primary    3 wk h/o L upper abdominal pain with radiation to flank, with UA with leukocytes. Treat as upper urinary tract infection with cipro 500mg  bid. Update labs today to eval for diverticulitis. UCx sent. Pt agrees with plan.       Relevant Orders   Comprehensive metabolic panel (Completed)   TSH (Completed)   CBC with Differential (Completed)   Lipase (Completed)   Urine culture   POCT Urinalysis Dipstick (Automated) (Completed)   Irritable bowel syndrome (IBS)   Relevant Medications   omeprazole (PRILOSEC) 20 MG capsule   Essential hypertension    Was only on hctz 25mg  daily, off losartan. However bp trending up - will restart combination losartan hctz 50/12.5mg  daily.       Relevant Medications   losartan-hydrochlorothiazide (HYZAAR) 50-12.5 MG tablet   Controlled type 2 diabetes mellitus without complication, without long-term current use of insulin (HCC)    Chronic, great control since weight  loss 2 yrs ago. Continues intermittent metformin use.       Relevant Medications   metFORMIN (GLUCOPHAGE) 500 MG tablet   losartan-hydrochlorothiazide (HYZAAR) 50-12.5 MG tablet   Other Relevant Orders   POCT glycosylated hemoglobin (Hb A1C) (Completed)       Meds ordered this encounter  Medications  . metFORMIN (GLUCOPHAGE) 500 MG tablet    Sig: Take 1 tablet (500 mg total) by mouth daily with breakfast.    Dispense:  90 tablet    Refill:  3  . omeprazole (PRILOSEC) 20 MG capsule     Sig: Take 1 capsule (20 mg total) by mouth daily as needed (heartburn).    Dispense:  30 capsule    Refill:  6  . losartan-hydrochlorothiazide (HYZAAR) 50-12.5 MG tablet    Sig: Take 1 tablet by mouth daily.    Dispense:  90 tablet    Refill:  3  . sertraline (ZOLOFT) 25 MG tablet    Sig: Take 1 tablet (25 mg total) by mouth daily.    Dispense:  30 tablet    Refill:  6  . ciprofloxacin (CIPRO) 500 MG tablet    Sig: Take 1 tablet (500 mg total) by mouth 2 (two) times daily.    Dispense:  14 tablet    Refill:  0   Orders Placed This Encounter  Procedures  . Urine culture  . Comprehensive metabolic panel  . TSH  . CBC with Differential  . Lipase  . POCT glycosylated hemoglobin (Hb A1C)  . POCT Urinalysis Dipstick (Automated)    Patient instructions: Labs today Urinalysis today.  Comienze sertraline 25mg  diarios para depresion/ansiedad.  Comienze losartan/hctz 50/12.5mg  diarios para presion arterial.  Yo investigare consejeria Laboratorios hoy para evaluar dolor abdominal.  Gusto verla hoy! Regresar en 3 meses para seguimiento.   Follow up plan: Return in about 3 months (around 11/20/2019) for follow up visit.  Ria Bush, MD

## 2019-08-20 NOTE — Assessment & Plan Note (Signed)
Chronic, great control since weight loss 2 yrs ago. Continues intermittent metformin use.

## 2019-08-20 NOTE — Patient Instructions (Addendum)
Labs today Urinalysis today.  Comienze sertraline 25mg  diarios para depresion/ansiedad.  Comienze losartan/hctz 50/12.5mg  diarios para presion arterial.  Yo investigare consejeria Laboratorios hoy para evaluar dolor abdominal.  Gusto verla hoy! Regresar en 3 meses para seguimiento.   Sentimientos suicidas: cmo ayudarse a s mismo Suicidal Feelings: How to Help Yourself El suicidio es cuando uno pone fin a su propia vida. Hay muchas cosas que puede hacer para ayudarse a s mismo a sentirse mejor cuando lucha contra estos sentimientos. Hay muchos servicios y personas disponibles para apoyarlos a usted y a Psychologist, sport and exercise sentimientos similares.  Si alguna vez siente que puede lastimarse o Market researcher a Producer, television/film/video, o tiene pensamientos de poner fin a su vida, busque ayuda de inmediato. Para obtener ayuda:  Comunquese con el servicio de emergencias de su localidad (911 en los [place]).  La lnea de asistencia de los servicios de salud y servicios humanos de [place] [address] (211 en [place]).  Dirjase al servicio de urgencias ms cercano.  Llame a una lnea directa para la prevencin del suicidio y hable con un consejero capacitado. Las siguientes lneas directas para la prevencin del suicidio estn disponibles en [place]: ? 1-800-273-TALK (719)482-3741). ? 1-800-SUICIDE (949)776-1413). ? [phone]. Esta es una lnea directa para hispanohablantes. ? [phone]. Esta es una lnea directa para usuarios de [place]. ? 1-866-4-U-TREVOR [phone]). Esta es una lnea directa para jvenes lesbianas, homosexuales, bisexuales, transexuales o que cuestionan su identidad sexual. ? Para obtener una lista de las lneas directas en [place], visite ParkingAffiliatePrograms.se.html  Comunquese con un centro de crisis o un centro de prevencin del suicidio local. Para encontrar un centro de crisis o un centro de prevencin del  suicidio local: ? Llame al hospital, la clnica, la organizacin de servicios comunitarios, el centro de salud mental, el proveedor de servicios sociales o el departamento de salud locales. Solicite ayuda para conectarse con un centro de crisis. ? Para obtener [employer] de los centros de crisis en [place], visite: suicidepreventionlifeline.org ? Para obtener una lista de los centros de crisis en [place], visite: suicideprevention.ca [employer] a sentirse mejor   Promtase a s mismo que no tomar medidas drsticas cuando tenga sentimientos suicidas. Recuerde que hay esperanza. Muchas personas han superado pensamientos y sentimientos suicidas y usted [place]. Si ha tenido estos sentimientos antes, recurdese que puede superarlos nuevamente.  [place] a familiares, amigos, maestros o consejeros cmo se siente. Trate de no separarse de aquellos que se preocupan por usted y desean ayudarlo. Hable con [place], [place] no sienta ganas de ser sociable. Las conversaciones en persona son mejores para ayudarlos a que entiendan sus sentimientos.  Comunquese con un profesional de la salud mental y trabaje con esta persona de modo regular.  Elabore un plan de seguridad que pueda seguir durante una crisis. Incluya nmeros de telfonos de las lneas directas para la prevencin del suicidio, de profesionales de la salud mental y de amigos y familiares de confianza a los que puede llamar durante Engineer, maintenance (IT). Guarde estos nmeros en su telfono.  Si est pensando en tomar muchos medicamentos, entrgueselos a alguien que pueda administrrselos segn lo indicado. Si toma antidepresivos y est preocupado porque podra tomar una sobredosis, informe a su mdico de modo que pueda indicarle medicamentos ms seguros.  Trate de ajustarse a sus rutinas. Programe sus das y Psychologist, forensic. Haga del cuidado propio una prioridad.  Elabore una lista de objetivos realistas y tchelos una  vez que  los [employer]. Los logros pueden Engineer, water.  Espere hasta que se sienta mejor antes de hacer cosas que le resulten difciles o desagradables.  Haga cosas que siempre disfrut para distraerse de sus sentimientos. Intente leer un libro, o escuchar o tocar msica. Pasar tiempo al [employer], en la naturaleza, puede ayudar a que se sienta mejor. Siga estas instrucciones en su casa:   Visite a su mdico de cabecera todos los aos para realizarse una revisin mdica.  Trabaje con un profesional de la salud mental segn lo sea necesario.  Consuma una dieta bien equilibrada e ingiera comidas a intervalos regulares.  Descanse mucho.  Si puede, haga actividad fsica. Slo 30 minutos de ejercicio al da pueden ayudarlo a sentirse mejor.  Use los medicamentos de venta libre y los recetados solamente como se lo haya indicado el mdico. Pregunte al profesional de salud mental sobre los posibles efectos secundarios de los medicamentos que est tomando.  No consuma alcohol ni drogas, y saque estas sustancias de su casa.  Saque armas, veneno, cuchillos y otros artculos mortales de su casa. Recomendaciones generales  Mantenga el lugar donde vive bien iluminado.  Cuando se sienta bien, escrbase una carta a usted mismo con sugerencias y palabras de apoyo que pueda leer cuando no se sienta bien.  Recuerde que las dificultades de la vida pueden superarse con [place]. Las afecciones pueden tratarse y usted puede aprender conductas y formas de pensar que lo ayudarn. Dnde buscar ms informacin  National Suicide Administrator ([place]): www.suicidepreventionlifeline.org  Hopeline ([place]): www.hopeline.com  [employer]'s Companies for Suicide Prevention (Fundacin Estadounidense para la prevencin del suicidio): PromotionalLoans.co.za  The [employer] ([place]) (para jvenes [place], homosexuales, bisexuales,  transexuales o que cuestionan su identidad sexual): www.thetrevorproject.org Comunquese con un mdico si:  Siente que es una carga para los dems.  Se siente preocupado, enojado, vengativo o tiene cambios de humor extremos.  Se ha apartado de su familia y amigos. Solicite ayuda inmediatamente si:  Habla acerca del suicidio o desea morir.  Comienza a hacer planes sobre cmo suicidarse.  Siente que no tiene motivos para vivir.  Comienza a hacer planes para poner en orden sus asuntos, despedirse o deshacerse de sus posesiones.  Siente culpa, vergenza o un dolor insoportable, y siente que no hay salida.  Est consumiendo alcohol y drogas con frecuencia.  Est teniendo conductas riesgosas que podran conducir a la [place]. Si tiene cualquiera de estos sntomas, [place] de inmediato. Llame a los servicios de emergencias, dirjase al departamento de emergencias o centro de crisis ms cercano, o llame a las lneas de ayuda para crisis de suicidio. Resumen  El suicidio es cuando uno se quita su propia vida.  Promtase a s mismo que no tomar medidas drsticas cuando tenga sentimientos suicidas.  [place] a familiares, amigos, maestros o consejeros cmo se siente.  Obtenga ayuda de inmediato si siente que es demasiado difcil lidiar con su vida y est pensando en el suicidio. Esta informacin no tiene Marine scientist el consejo del mdico. Asegrese de hacerle al mdico cualquier pregunta que tenga. Document Released: 07/18/2006 Document Revised: 01/12/2019 Document Reviewed: 01/12/2019 Elsevier Patient Education  [place].

## 2019-08-21 NOTE — Assessment & Plan Note (Signed)
3 wk h/o L upper abdominal pain with radiation to flank, with UA with leukocytes. Treat as upper urinary tract infection with cipro 500mg  bid. Update labs today to eval for diverticulitis. UCx sent. Pt agrees with plan.

## 2019-08-21 NOTE — Assessment & Plan Note (Signed)
Was only on hctz 25mg  daily, off losartan. However bp trending up - will restart combination losartan hctz 50/12.5mg  daily.

## 2019-08-21 NOTE — Assessment & Plan Note (Addendum)
Endorses longstanding issue with depression with SI and suicide attempts as teenager, currently contracts for safety. Did recommend starting medication - will start sertraline 25mg  daily. Reviewed increased suicidality risk of antidepressants. Suicide prevention handout provided today. Chronic issue, has not seeked treatment, difficulty affording counseling. I will ask our care coordinator to investigate affordable counseling options. RTC 3 mo f/u visit. Pt agrees.

## 2019-08-22 LAB — URINE CULTURE
MICRO NUMBER:: 1068832
Result:: NO GROWTH
SPECIMEN QUALITY:: ADEQUATE

## 2019-08-25 ENCOUNTER — Telehealth: Payer: Self-pay

## 2019-08-25 NOTE — Telephone Encounter (Signed)
Copied from [place] [id number]. Topic: Referral - Status >> Aug 25, 2019  [id number] PM Simone Curia D wrote: [id number] Spoke with patient about counseling resources at [facility] which is free of charge. Ambrose Mantle [phone]

## 2019-09-03 ENCOUNTER — Telehealth: Payer: Self-pay

## 2019-09-03 NOTE — Telephone Encounter (Signed)
Copied from [place] [phone]. Topic: Referral - Status >> Sep 03, 2019  123456 PM Simone Curia D wrote: [id number] Spoke with patient she is going to schedule an appointment in the next few days. She has to work around her job schedule. She has no other needs right now.  Ambrose Mantle [phone]

## 2019-11-17 ENCOUNTER — Encounter: Payer: Self-pay | Admitting: Family Medicine

## 2019-11-23 NOTE — Telephone Encounter (Signed)
Called and spoke with patient.  She actually had concerns about son's upcoming appointment.

## 2019-11-27 ENCOUNTER — Ambulatory Visit (INDEPENDENT_AMBULATORY_CARE_PROVIDER_SITE_OTHER): Payer: Self-pay | Admitting: Family Medicine

## 2019-11-27 ENCOUNTER — Encounter: Payer: Self-pay | Admitting: Family Medicine

## 2019-11-27 ENCOUNTER — Other Ambulatory Visit: Payer: Self-pay

## 2019-11-27 VITALS — BP 144/84 | HR 81 | Temp 97.8°F | Ht 64.0 in | Wt 176.5 lb

## 2019-11-27 DIAGNOSIS — F331 Major depressive disorder, recurrent, moderate: Secondary | ICD-10-CM

## 2019-11-27 DIAGNOSIS — I10 Essential (primary) hypertension: Secondary | ICD-10-CM

## 2019-11-27 DIAGNOSIS — E119 Type 2 diabetes mellitus without complications: Secondary | ICD-10-CM

## 2019-11-27 LAB — POCT GLYCOSYLATED HEMOGLOBIN (HGB A1C): Hemoglobin A1C: 5.5 % (ref 4.0–5.6)

## 2019-11-27 NOTE — Patient Instructions (Addendum)
A1c today.  Puede volver a empezar sertraline 25mg  diarios.  Regresar en 6 meses para proximo fisico.

## 2019-11-27 NOTE — Progress Notes (Signed)
This visit was conducted in person.  BP (!) 144/84 (BP Location: Right Arm, Patient Position: Sitting, Cuff Size: Normal)   Pulse 81   Temp 97.8 F (36.6 C) (Temporal)   Ht 5\' 4"  (1.626 m)   Wt 176 lb 8 oz (80.1 kg)   LMP 10/06/2014   SpO2 98%   BMI 30.30 kg/m    CC: 3 mo f/u visit Subjective:    Patient ID: Amy [name], female    DOB: 1971-03-10, 49 y.o.   MRN: [id number]  HPI: Amy [name] is a 49 y.o. female presenting on 11/27/2019 for Depression (Here for f/u. )   See prior note for details.  Overall improvement in mood troubles reviewed at last visit. She was unable to afford counselor. Ongoing stressors at work as well as marital stress. Some weight gain noted. She only took sertraline 25mg  for 1 month, but would be interested in continuing this as she felt benefit from this - discussed she has refills available at the pharmacy.   HTN - compliant with losartan/hctz 50/12.5mg  daily. Denies HA, vision changes, CP/tightness, SOB, leg swelling.   DM - well controlled with weight loss and on metformin 500mg  once daily.  Lab Results  Component Value Date   HGBA1C 5.5 11/27/2019   Diabetic Foot Exam - Simple   Simple Foot Form Diabetic Foot exam was performed with the following findings: Yes 11/27/2019 10:44 AM  Visual Inspection No deformities, no ulcerations, no other skin breakdown bilaterally: Yes Sensation Testing Intact to touch and monofilament testing bilaterally: Yes Pulse Check Posterior Tibialis and Dorsalis pulse intact bilaterally: Yes Comments         Relevant past medical, surgical, family and social history reviewed and updated as indicated. Interim medical history since our last visit reviewed. Allergies and medications reviewed and updated. Outpatient Medications Prior to Visit  Medication Sig Dispense Refill  . cyanocobalamin (V-R VITAMIN B-12) 500 MCG tablet Take 1 tablet (500 mcg total) by mouth daily.    Marland Kitchen glucose blood (RELION GLUCOSE  TEST STRIPS) test strip Use as instructed 100 each 12  . losartan-hydrochlorothiazide (HYZAAR) 50-12.5 MG tablet Take 1 tablet by mouth daily. 90 tablet 3  . metFORMIN (GLUCOPHAGE) 500 MG tablet Take 1 tablet (500 mg total) by mouth daily with breakfast. 90 tablet 3  . omeprazole (PRILOSEC) 20 MG capsule Take 1 capsule (20 mg total) by mouth daily as needed (heartburn). 30 capsule 6  . sertraline (ZOLOFT) 25 MG tablet Take 1 tablet (25 mg total) by mouth daily. 30 tablet 6  . Biotin 5 MG CAPS Take 1 capsule by mouth daily.    . ciprofloxacin (CIPRO) 500 MG tablet Take 1 tablet (500 mg total) by mouth 2 (two) times daily. 14 tablet 0   No facility-administered medications prior to visit.     Per HPI unless specifically indicated in ROS section below Review of Systems Objective:    BP (!) 144/84 (BP Location: Right Arm, Patient Position: Sitting, Cuff Size: Normal)   Pulse 81   Temp 97.8 F (36.6 C) (Temporal)   Ht 5\' 4"  (1.626 m)   Wt 176 lb 8 oz (80.1 kg)   LMP 10/06/2014   SpO2 98%   BMI 30.30 kg/m   Wt Readings from Last 3 Encounters:  11/27/19 176 lb 8 oz (80.1 kg)  08/20/19 170 lb 3 oz (77.2 kg)  03/30/19 172 lb (78 kg)    Physical Exam Vitals and nursing note reviewed.  Constitutional:  General: She is not in acute distress.    Appearance: Normal appearance. She is well-developed. She is not ill-appearing.  Eyes:     General: No scleral icterus.    Extraocular Movements: Extraocular movements intact.     Conjunctiva/sclera: Conjunctivae normal.     Pupils: Pupils are equal, round, and reactive to light.  Cardiovascular:     Rate and Rhythm: Normal rate and regular rhythm.     Pulses: Normal pulses.     Heart sounds: Normal heart sounds. No murmur.  Pulmonary:     Effort: Pulmonary effort is normal. No respiratory distress.     Breath sounds: Normal breath sounds. No wheezing, rhonchi or rales.  Musculoskeletal:     Cervical back: Normal range of motion and  neck supple.     Right lower leg: No edema.     Left lower leg: No edema.     Comments: See HPI for foot exam if done  Lymphadenopathy:     Cervical: No cervical adenopathy.  Skin:    General: Skin is warm and dry.     Findings: No rash.  Neurological:     Mental Status: She is alert.  Psychiatric:        Mood and Affect: Mood normal.        Behavior: Behavior normal.       Results for orders placed or performed in visit on 11/27/19  POCT glycosylated hemoglobin (Hb A1C)  Result Value Ref Range   Hemoglobin A1C 5.5 4.0 - 5.6 %   HbA1c POC (<> result, manual entry)     HbA1c, POC (prediabetic range)     HbA1c, POC (controlled diabetic range)     Depression screen [facility] 2/9 11/27/2019 08/20/2019  Decreased Interest 1 3  Down, Depressed, Hopeless 1 3  PHQ - 2 Score 2 6  Altered sleeping 0 2  Tired, decreased energy 2 2  Change in appetite 2 3  Feeling bad or failure about yourself  1 3  Trouble concentrating 1 3  Moving slowly or fidgety/restless 2 2  Suicidal thoughts 0 3  PHQ-9 Score 10 24    GAD 7 : Generalized Anxiety Score 11/27/2019 08/20/2019  Nervous, Anxious, on Edge 1 3  Control/stop worrying 1 3  Worry too much - different things 1 3  Trouble relaxing 2 3  Restless 1 2  Easily annoyed or irritable 3 3  Afraid - awful might happen 3 3  Total GAD 7 Score 12 20   Assessment & Plan:  This visit occurred during the SARS-CoV-2 public health emergency.  Safety protocols were in place, including screening questions prior to the visit, additional usage of staff PPE, and extensive cleaning of exam room while observing appropriate contact time as indicated for disinfecting solutions.   Problem List Items Addressed This Visit    MDD (major depressive disorder), recurrent episode, moderate ([place]) - Primary    Marked improvement since last visit. Support provided regarding work/family stressors. Benefited from sertraline but only took a month - discussed refills available at  the pharmacy - she will refill.       Essential hypertension    Mildly elevated despite current regimen - continue to monitor.       Controlled type 2 diabetes mellitus without complication, without long-term current use of insulin ([place])    Continued improvement noted. Could consider stopping metformin in the future.       Relevant Orders   POCT glycosylated hemoglobin (Hb A1C) (Completed)  No orders of the defined types were placed in this encounter.  Orders Placed This Encounter  Procedures  . POCT glycosylated hemoglobin (Hb A1C)   Patient Instructions  A1c today.  Puede volver a empezar sertraline 25mg  diarios.  Regresar en 6 meses para proximo fisico.    Follow up plan: Return in about 6 months (around 05/26/2020) for annual exam, prior fasting for blood work.  Ria Bush, MD

## 2019-11-29 NOTE — Assessment & Plan Note (Signed)
Continued improvement noted. Could consider stopping metformin in the future.

## 2019-11-29 NOTE — Assessment & Plan Note (Addendum)
Marked improvement since last visit. Support provided regarding work/family stressors. Benefited from sertraline but only took a month - discussed refills available at the pharmacy - she will refill.

## 2019-11-29 NOTE — Assessment & Plan Note (Signed)
Mildly elevated despite current regimen - continue to monitor.

## 2019-11-30 ENCOUNTER — Encounter: Payer: Self-pay | Admitting: Family Medicine

## 2019-11-30 ENCOUNTER — Ambulatory Visit (INDEPENDENT_AMBULATORY_CARE_PROVIDER_SITE_OTHER): Payer: Self-pay | Admitting: Family Medicine

## 2019-11-30 ENCOUNTER — Ambulatory Visit: Payer: Self-pay | Attending: Internal Medicine

## 2019-11-30 DIAGNOSIS — U071 COVID-19: Secondary | ICD-10-CM | POA: Insufficient documentation

## 2019-11-30 DIAGNOSIS — Z20822 Contact with and (suspected) exposure to covid-19: Secondary | ICD-10-CM

## 2019-11-30 DIAGNOSIS — R0981 Nasal congestion: Secondary | ICD-10-CM

## 2019-11-30 NOTE — Assessment & Plan Note (Signed)
Sinus congestion associated with chills, abd pain and body aches. Family also ill. Reasonable to stay out of work until [employer] test returns. Discussed if she or any of her family are positive will recommend self-quarantine at home. If entire family tests negative and she is feeling better, may be reasonable to return to work later this week - depending on how symptoms progress. Will await covid test.  Supportive care in the meantime - fluids, rest, motrin, vitamin C and D and zinc to boost immune system.

## 2019-11-30 NOTE — Progress Notes (Signed)
Virtual visit completed through [employer]. Due to national recommendations of social distancing due to COVID-19, a virtual visit is felt to be most appropriate for this patient at this time. Reviewed limitations of a virtual visit.   Patient location: home Provider location: [place] [facility], office If any vitals were documented, they were collected by patient at home unless specified below.    Ht 5\' 4"  (1.626 m)   Wt 170 lb (77.1 kg)   LMP 10/06/2014   BMI 29.18 kg/m    CC: ?sinusitis Subjective:    Patient ID: Amy [name], female    DOB: [birth year], 49 y.o.   MRN: [id number]  HPI: Amy [name] is a 49 y.o. female presenting on 11/30/2019 for Sinus Problem, Headache, and Fever   Yesterday was caring for grandchild - walked home in the cold rain. Last night developed headache and chills, with associated facial pain/pressure reproducible with palpation. She had some abd pain last night as well. Body aches.   Denies fever, cough, dyspnea, loss of taste or smell, ST, nausea or diarrhea.    She has been taking doterra essential oils and 400mg  motrin with benefit.   Husband has also developed hoarseness  Daughter with sneezing and cough for the past week.   She has stayed out of work today and this morning her family went for covid-19 swab at [facility] - results pending.      Relevant past medical, surgical, family and social history reviewed and updated as indicated. Interim medical history since our last visit reviewed. Allergies and medications reviewed and updated. Outpatient Medications Prior to Visit  Medication Sig Dispense Refill  . Biotin 5 MG CAPS Take 1 capsule by mouth daily.    . cyanocobalamin (V-R VITAMIN B-12) 500 MCG tablet Take 1 tablet (500 mcg total) by mouth daily.    Marland Kitchen glucose blood (RELION GLUCOSE TEST STRIPS) test strip Use as instructed 100 each 12  . losartan-hydrochlorothiazide (HYZAAR) 50-12.5 MG tablet Take 1 tablet by mouth daily. 90  tablet 3  . metFORMIN (GLUCOPHAGE) 500 MG tablet Take 1 tablet (500 mg total) by mouth daily with breakfast. 90 tablet 3  . omeprazole (PRILOSEC) 20 MG capsule Take 1 capsule (20 mg total) by mouth daily as needed (heartburn). 30 capsule 6  . sertraline (ZOLOFT) 25 MG tablet Take 1 tablet (25 mg total) by mouth daily. 30 tablet 6   No facility-administered medications prior to visit.     Per HPI unless specifically indicated in ROS section below Review of Systems Objective:    Ht 5\' 4"  (1.626 m)   Wt 170 lb (77.1 kg)   LMP 10/06/2014   BMI 29.18 kg/m   Wt Readings from Last 3 Encounters:  11/30/19 170 lb (77.1 kg)  11/27/19 176 lb 8 oz (80.1 kg)  08/20/19 170 lb 3 oz (77.2 kg)     Physical exam: Gen: alert, NAD, not ill appearing Pulm: speaks in complete sentences without increased work of breathing Psych: normal mood, normal thought content      Results for orders placed or performed in visit on 11/27/19  POCT glycosylated hemoglobin (Hb A1C)  Result Value Ref Range   Hemoglobin A1C 5.5 4.0 - 5.6 %   HbA1c POC (<> result, manual entry)     HbA1c, POC (prediabetic range)     HbA1c, POC (controlled diabetic range)     Assessment & Plan:   Problem List Items Addressed This Visit    Head congestion  Sinus congestion associated with chills, abd pain and body aches. Family also ill. Reasonable to stay out of work until [employer] test returns. Discussed if she or any of her family are positive will recommend self-quarantine at home. If entire family tests negative and she is feeling better, may be reasonable to return to work later this week - depending on how symptoms progress. Will await covid test.  Supportive care in the meantime - fluids, rest, motrin, vitamin C and D and zinc to boost immune system.           No orders of the defined types were placed in this encounter.  No orders of the defined types were placed in this encounter.   I discussed the assessment and  treatment plan with the patient. The patient was provided an opportunity to ask questions and all were answered. The patient agreed with the plan and demonstrated an understanding of the instructions. The patient was advised to call back or seek an in-person evaluation if the symptoms worsen or if the condition fails to improve as anticipated.  Follow up plan: Return if symptoms worsen or fail to improve.  Ria Bush, MD

## 2019-12-01 LAB — NOVEL CORONAVIRUS, NAA: SARS-CoV-2, NAA: DETECTED — AB

## 2019-12-02 ENCOUNTER — Encounter: Payer: Self-pay | Admitting: Family Medicine

## 2019-12-02 NOTE — Telephone Encounter (Signed)
Patient tested positive for covid plz call for update on symptoms.  I also sent her mychart message.

## 2019-12-07 ENCOUNTER — Telehealth: Payer: Self-pay | Admitting: Family Medicine

## 2019-12-07 NOTE — Telephone Encounter (Signed)
Don't recommend retesting but rather complete 10 day quarantine at home and may return to work once this is complete and she is fever free with improvement in respiratory symptoms.  RTW date would be: 12/10/2019. I can write letter as such.  Let me know if work doesn't accept my letter.  How are her symptoms? What happened with antibody infusion treatment (don't see she got it)

## 2019-12-07 NOTE — Telephone Encounter (Signed)
Patient stated she tested positive for covid. She will need to be retested in order for her to return to work. She would like to know how long she should wait before she goes to be tested.  Please advise

## 2019-12-08 ENCOUNTER — Ambulatory Visit: Payer: Self-pay | Attending: Internal Medicine

## 2019-12-08 DIAGNOSIS — Z20822 Contact with and (suspected) exposure to covid-19: Secondary | ICD-10-CM | POA: Insufficient documentation

## 2019-12-08 NOTE — Telephone Encounter (Signed)
Letter written and in Amy [name]'s box.  Even if positive, ok to return to work 12/10/2019 if symptoms better  Even if negative, would wait to return to work 12/10/2019 if symptoms better.

## 2019-12-08 NOTE — Telephone Encounter (Signed)
No symptoms present. Feels fine now. Her work is requiring her to be re tested and show her manager the negative result. Patient went to get re tested today and the nurse there said to have patient let her PCP know about this and see if PCP can make a letter stating that even if the result is positive it does not mean that she is contagious. Something to that effect, in case re test shows positive results.

## 2019-12-08 NOTE — Telephone Encounter (Signed)
Lvm asking pt to call back.  Need to relay Dr. G's message.  

## 2019-12-08 NOTE — Telephone Encounter (Addendum)
Spoke with pt relaying Dr. Synthia Innocent message and notifying her the letter is being mailed.  Pt verbalizes understanding.   Mailed letter.

## 2019-12-09 LAB — NOVEL CORONAVIRUS, NAA: SARS-CoV-2, NAA: NOT DETECTED

## 2020-01-04 LAB — HM DIABETES EYE EXAM

## 2020-01-07 ENCOUNTER — Encounter: Payer: Self-pay | Admitting: Family Medicine

## 2020-08-28 ENCOUNTER — Other Ambulatory Visit: Payer: Self-pay | Admitting: Family Medicine

## 2020-10-10 ENCOUNTER — Ambulatory Visit (INDEPENDENT_AMBULATORY_CARE_PROVIDER_SITE_OTHER): Payer: Self-pay | Admitting: Family Medicine

## 2020-10-10 ENCOUNTER — Other Ambulatory Visit: Payer: Self-pay

## 2020-10-10 ENCOUNTER — Encounter: Payer: Self-pay | Admitting: Family Medicine

## 2020-10-10 VITALS — BP 162/88 | HR 78 | Temp 98.2°F | Ht 64.0 in | Wt 181.1 lb

## 2020-10-10 DIAGNOSIS — F331 Major depressive disorder, recurrent, moderate: Secondary | ICD-10-CM

## 2020-10-10 DIAGNOSIS — R7303 Prediabetes: Secondary | ICD-10-CM

## 2020-10-10 DIAGNOSIS — I10 Essential (primary) hypertension: Secondary | ICD-10-CM

## 2020-10-10 LAB — POCT GLYCOSYLATED HEMOGLOBIN (HGB A1C): Hemoglobin A1C: 5.7 % — AB (ref 4.0–5.6)

## 2020-10-10 MED ORDER — LOSARTAN POTASSIUM-HCTZ 50-12.5 MG PO TABS: 1.0000 | ORAL_TABLET | Freq: Every day | ORAL | 3 refills | Status: DC

## 2020-10-10 NOTE — Patient Instructions (Addendum)
Regrese para fisico con laboratorios en 2-3 meses.  Su azucar sigue muy bien controlada!  Su presion arterial esta elevada - revise sus niveles en casa diariamente. Meta <140/90, idealmente <130/80. Dejeme saber si sigue alta >150/100.  Gusto verla hoy.   Cmo controlar su hipertensin Managing Your Hypertension La hipertensin se denomina usualmente presin arterial alta. Ocurre cuando la sangre presiona contra las paredes de las arterias con demasiada fuerza. Las arterias son los vasos sanguneos que transportan la sangre desde el corazn hacia todas las partes del cuerpo. La hipertensin hace que el corazn haga ms esfuerzo para Chiropodist y Dana Corporation que las arterias se Teacher, music o Advertising account executive. La hipertensin no tratada o no controlada puede causar infarto de miocardio, accidente cerebrovascular, enfermedad renal y otros problemas. Qu son las Futures trader de presin arterial? Una lectura de la presin arterial consiste de un nmero ms alto sobre un nmero ms bajo. En condiciones ideales, la presin arterial debe estar por debajo de 120/80. El primer nmero ("superior") es la presin sistlica. Es la medida de la presin de las arterias cuando el corazn late. El segundo nmero ("inferior") es la presin diastlica. Es la medida de la presin en las arterias cuando el corazn se relaja. Qu significa mi lectura de presin arterial? La presin arterial se clasifica en cuatro etapas. Sobre la base de la lectura de su presin arterial, el mdico puede usar las siguientes etapas para determinar si necesita tratamiento y de qu tipo. La presin sistlica y la presin diastlica se miden en una unidad llamada mm Hg. Normal  Presin sistlica: por debajo de 950.  Presin diastlica: por debajo de 80. Elevada  Presin sistlica: 932-671.  Presin diastlica: por debajo de 80. Etapa 1 de hipertensin  Presin sistlica: 245-809.  Presin diastlica: 98-33. Etapa 2 de  hipertensin  Presin sistlica: 825 o ms.  Presin diastlica: 90 o ms. Cules son los riesgos para la salud asociados con la hipertensin? Controlar la hipertensin es una responsabilidad importante. La hipertensin no controlada puede causar:  Infarto de miocardio.  Accidente cerebrovascular.  Debilitamiento de los vasos sanguneos (aneurisma).  Insuficiencia cardaca.  Dao renal.  Dao ocular.  Sndrome metablico.  Problemas de memoria y concentracin. Qu cambios puedo hacer para controlar mi hipertensin? La hipertensin se puede controlar haciendo McDonald[employer] Corporation estilo de vida y, posiblemente, tomando medicamentos. Su mdico le ayudar a crear un plan para bajar la presin arterial al rango normal. Comida y bebida   Siga una dieta con alto contenido de fibras y [place], y con bajo contenido de sal (sodio), azcar agregada y Daphene Jaeger. Un ejemplo de plan alimenticio es la dieta DASH (Dietary Approaches to Stop Hypertension, Mtodos alimenticios para detener la hipertensin). Para alimentarse de esta manera: ? Coma mucha fruta y [place]. Trate de que la mitad del plato de cada comida sea de frutas y verduras. ? Coma cereales integrales, como pasta integral, arroz integral y pan integral. Llene aproximadamente un cuarto del plato con cereales integrales. ? Consuma productos lcteos con bajo contenido de grasa. ? Evite la ingesta de cortes de carne grasa, carne procesada o curada, y carne de ave con piel. Llene aproximadamente un cuarto del plato con protenas magras, como pescado, pollo sin piel, frijoles, huevos y tofu. ? Evite ingerir alimentos prehechos o procesados. En general, estos tienen mayor cantidad de sodio, azcar agregada y Wendee Copp.  Reduzca su ingesta diaria de sodio. La State Farm de las personas que tienen hipertensin deben comer menos de 1500 mg  de Genu[employer] Parts.  Limite el consumo de alcohol a no ms de 1 medida por da si es mujer y no est Orthoptist  y a 2 medidas por da si es hombre. Una medida equivale a 12onzas de cerveza, 5onzas de vino o 1onzas de bebidas alcohlicas de alta graduacin. Estilo de vida  Trabaje con su mdico para mantener un peso saludable o Curator. Pregntele cual es su peso recomendado.  Realice al menos 30 minutos de ejercicio que haga que se acelere su corazn (ejercicio Magazine features editor) la [employer]V de la [place]. Estas actividades pueden incluir caminar, nadar o andar en bicicleta.  Incluya ejercicios para fortalecer sus msculos (ejercicios de resistencia), como levantamiento de pesas, como parte de su rutina semanal de ejercicios. Intente realizar de este tipo de ejercicios al Kellogg a la [place].  No consuma ningn producto que contenga nicotina o tabaco, como cigarrillos y Administrator, Civil Service. Si necesita ayuda para dejar de fumar, consulte al American Express.  Controle las enfermedades a largo plazo (crnicas), como el colesterol alto o la diabetes. Control  Contrlese la presin arterial en su casa segn las indicaciones del mdico. La presin arterial deseada puede variar en funcin de las enfermedades, la edad y otros factores personales.  Contrlese la presin arterial de manera regular, en la frecuencia indicada por su mdico. Trabaje con su mdico  Revise con su mdico todos los medicamentos que toma ya que puede haber efectos secundarios o interacciones.  Hable con su mdico acerca de la dieta, hbitos de ejercicio y otros factores del estilo de vida que pueden contribuir a la hipertensin.  Consulte a su mdico regularmente. Su mdico puede ayudarle a crear y [place] su plan para controlar la hipertensin. Debo tomar un medicamento para controlar mi presin arterial? El mdico puede recetarle medicamentos si los cambios en el estilo de vida no son suficientes para Museum/gallery curator la presin arterial y si:  Su presin arterial sistlica es de 130 o ms.  Su presin  arterial diastlica es de 80 o ms. Tome los medicamentos solamente como se lo haya indicado el mdico. Siga cuidadosamente las indicaciones. Los medicamentos para la presin arterial deben tomarse segn las indicaciones. Los medicamentos pierden eficacia al omitir las dosis. El hecho de omitir las dosis tambin [place] el riesgo de otros problemas. Comunquese con un mdico si:  Piensa que tiene Runner, broadcasting/film/video a los medicamentos que ha tomado.  Tiene dolores de cabeza frecuentes (recurrentes).  Siente mareos.  Tiene hinchazn en los tobillos.  Tiene problemas de visin. Solicite ayuda de inmediato si:  Siente un dolor de cabeza intenso o confusin.  Siente debilidad inusual, adormecimiento o que Hospital doctor.  Siente un dolor intenso en el pecho o el abdomen.  Vomita repetidas veces.  Tiene dificultad para respirar. Resumen  La hipertensin se produce cuando la sangre bombea en las arterias con mucha fuerza. Si esta afeccin no se controla, podra correr riesgo de tener complicaciones graves.  La presin arterial deseada puede variar en funcin de las enfermedades, la edad y otros factores personales. Para la [employer], una presin arterial normal es menor que 120/80.  La hipertensin se puede controlar mediante cambios en el estilo de vida, tomando medicamentos, o ambas cosas. Los Danaher Corporation estilo de vida incluyen prdida de peso, ingerir alimentos sanos, seguir una dieta baja en sodio, hacer ms ejercicio y Glass blower/designer consumo de alcohol. Esta informacin no tiene Theme park manager el consejo  del mdico. Asegrese de hacerle al mdico cualquier pregunta que tenga. Document Revised: 12/10/2016 Document Reviewed: 09/12/2016 Elsevier Patient Education  2020 [employer].

## 2020-10-10 NOTE — Assessment & Plan Note (Signed)
Stable period off medication. She has learned how to manage depressed mood on her own.

## 2020-10-10 NOTE — Assessment & Plan Note (Signed)
Deteriorated despite regularly taking hyzaar 50/12.5mg  daily.  She will start checking at home and update me with readings in 1 wk. If persistently high, low threshold to increase antihypertensive dose.  Pt agrees with plan.

## 2020-10-10 NOTE — Assessment & Plan Note (Signed)
Congratulated on ongoing improvement.  Encouraged continuing low sugar low carb diet.

## 2020-10-10 NOTE — Progress Notes (Signed)
Patient ID: [name], female    DOB: 01-31-1971, 49 y.o.   MRN: [id number]  This visit was conducted in person.  BP (!) 162/88 (BP Location: Right Arm, Cuff Size: Normal)   Pulse 78   Temp 98.2 F (36.8 C) (Temporal)   Ht 5\' 4"  (1.626 m)   Wt 181 lb 1 oz (82.1 kg)   LMP 10/06/2014   SpO2 98%   BMI 31.08 kg/m    CC: f/u HTN Subjective:   HPI: GEORGE ALCANTAR is a 49 y.o. female presenting on 10/10/2020 for Hypertension (Here for f/u.)   Recent trip to [place]/12/2020 07/2020.   HTN - Compliant with current antihypertensive regimen of losartan hctz 50/12.5mg .  Does not check blood pressures at home - advised to check at home. No low blood pressure readings or symptoms of dizziness/syncope. Denies vision changes, CP/tightness, SOB, leg swelling. Intermittent HA.   History of DM - has been off metformin 500mg  daily once improved sugar control.  Lab Results  Component Value Date   HGBA1C 5.7 (A) 10/10/2020    Diabetic Foot Exam - Simple   Simple Foot Form Diabetic Foot exam was performed with the following findings: Yes 10/10/2020  4:16 PM  Visual Inspection No deformities, no ulcerations, no other skin breakdown bilaterally: Yes Sensation Testing Intact to touch and monofilament testing bilaterally: Yes Pulse Check Posterior Tibialis and Dorsalis pulse intact bilaterally: Yes Comments         Relevant past medical, surgical, family and social history reviewed and updated as indicated. Interim medical history since our last visit reviewed. Allergies and medications reviewed and updated. Outpatient Medications Prior to Visit  Medication Sig Dispense Refill  . Ascorbic Acid (VITAMIN C) 100 MG tablet Take 100 mg by mouth daily.    . Biotin 5 MG CAPS Take 1 capsule by mouth daily.    . Cholecalciferol (VITAMIN D3 PO) Take by mouth daily.    . cyanocobalamin (V-R VITAMIN B-12) 500 MCG tablet Take 1 tablet (500 mcg total) by mouth daily.    10/12/2020 glucose blood (RELION  GLUCOSE TEST STRIPS) test strip Use as instructed 100 each 12  . Multiple Vitamins-Minerals (ZINC PO) Take by mouth daily.    10/12/2020 losartan-hydrochlorothiazide (HYZAAR) 50-12.5 MG tablet Take 1 tablet by mouth once daily 90 tablet 0  . metFORMIN (GLUCOPHAGE) 500 MG tablet Take 1 tablet (500 mg total) by mouth daily with breakfast. (Patient not taking: Reported on 10/10/2020) 90 tablet 3  . omeprazole (PRILOSEC) 20 MG capsule Take 1 capsule (20 mg total) by mouth daily as needed (heartburn). 30 capsule 6  . sertraline (ZOLOFT) 25 MG tablet Take 1 tablet (25 mg total) by mouth daily. 30 tablet 6   No facility-administered medications prior to visit.     Per HPI unless specifically indicated in ROS section below Review of Systems Objective:  BP (!) 162/88 (BP Location: Right Arm, Cuff Size: Normal)   Pulse 78   Temp 98.2 F (36.8 C) (Temporal)   Ht 5\' 4"  (1.626 m)   Wt 181 lb 1 oz (82.1 kg)   LMP 10/06/2014   SpO2 98%   BMI 31.08 kg/m   Wt Readings from Last 3 Encounters:  10/10/20 181 lb 1 oz (82.1 kg)  11/30/19 170 lb (77.1 kg)  11/27/19 176 lb 8 oz (80.1 kg)      Physical Exam    Results for orders placed or performed in visit on 10/10/20  POCT glycosylated hemoglobin (  Hb A1C)  Result Value Ref Range   Hemoglobin A1C 5.7 (A) 4.0 - 5.6 %   HbA1c POC (<> result, manual entry)     HbA1c, POC (prediabetic range)     HbA1c, POC (controlled diabetic range)     Assessment & Plan:  This visit occurred during the SARS-CoV-2 public health emergency.  Safety protocols were in place, including screening questions prior to the visit, additional usage of staff PPE, and extensive cleaning of exam room while observing appropriate contact time as indicated for disinfecting solutions.   Problem List Items Addressed This Visit    Prediabetes    Congratulated on ongoing improvement.  Encouraged continuing low sugar low carb diet.       Relevant Orders   POCT glycosylated hemoglobin (Hb A1C)  (Completed)   MDD (major depressive disorder), recurrent episode, moderate (HCC)    Stable period off medication. She has learned how to manage depressed mood on her own.       Essential hypertension - Primary    Deteriorated despite regularly taking hyzaar 50/12.5mg  daily.  She will start checking at home and update me with readings in 1 wk. If persistently high, low threshold to increase antihypertensive dose.  Pt agrees with plan.       Relevant Medications   losartan-hydrochlorothiazide (HYZAAR) 50-12.5 MG tablet       Meds ordered this encounter  Medications  . losartan-hydrochlorothiazide (HYZAAR) 50-12.5 MG tablet    Sig: Take 1 tablet by mouth daily.    Dispense:  90 tablet    Refill:  3   Orders Placed This Encounter  Procedures  . POCT glycosylated hemoglobin (Hb A1C)    Patient instructions: Regrese para fisico con laboratorios en 2-3 meses.  Su azucar sigue muy bien controlada!  Su presion arterial esta elevada - revise sus niveles en casa diariamente. Meta <140/90, idealmente <130/80. Dejeme saber si sigue alta >150/100.  Gusto verla hoy.   Follow up plan: Return in about 3 months (around 01/08/2021) for annual exam, prior fasting for blood work.  Ria Bush, MD

## 2020-12-29 ENCOUNTER — Other Ambulatory Visit: Payer: Self-pay | Admitting: Family Medicine

## 2020-12-29 DIAGNOSIS — E66811 Obesity, class 1: Secondary | ICD-10-CM | POA: Insufficient documentation

## 2020-12-29 DIAGNOSIS — R7303 Prediabetes: Secondary | ICD-10-CM

## 2020-12-29 DIAGNOSIS — E559 Vitamin D deficiency, unspecified: Secondary | ICD-10-CM

## 2020-12-29 DIAGNOSIS — E669 Obesity, unspecified: Secondary | ICD-10-CM | POA: Insufficient documentation

## 2020-12-29 DIAGNOSIS — E663 Overweight: Secondary | ICD-10-CM | POA: Insufficient documentation

## 2020-12-30 ENCOUNTER — Other Ambulatory Visit: Payer: Self-pay

## 2021-01-06 ENCOUNTER — Encounter: Payer: Self-pay | Admitting: Family Medicine

## 2021-02-08 ENCOUNTER — Other Ambulatory Visit: Payer: Self-pay | Admitting: Family Medicine

## 2021-03-01 ENCOUNTER — Encounter: Payer: Self-pay | Admitting: Family Medicine

## 2021-04-14 ENCOUNTER — Other Ambulatory Visit: Payer: Self-pay

## 2021-04-19 ENCOUNTER — Other Ambulatory Visit: Payer: Self-pay

## 2021-04-19 ENCOUNTER — Encounter: Payer: Self-pay | Admitting: Family Medicine

## 2021-04-19 ENCOUNTER — Ambulatory Visit (INDEPENDENT_AMBULATORY_CARE_PROVIDER_SITE_OTHER): Payer: Self-pay | Admitting: Family Medicine

## 2021-04-19 VITALS — BP 120/74 | HR 63 | Temp 97.2°F | Ht 64.75 in | Wt 166.0 lb

## 2021-04-19 DIAGNOSIS — R7303 Prediabetes: Secondary | ICD-10-CM

## 2021-04-19 DIAGNOSIS — I10 Essential (primary) hypertension: Secondary | ICD-10-CM

## 2021-04-19 DIAGNOSIS — R519 Headache, unspecified: Secondary | ICD-10-CM

## 2021-04-19 MED ORDER — VITAMIN D3 25 MCG (1000 UT) PO CAPS: 1.0000 | ORAL_CAPSULE | Freq: Every day | ORAL | Status: DC

## 2021-04-19 MED ORDER — LOSARTAN POTASSIUM-HCTZ 50-12.5 MG PO TABS: 1.0000 | ORAL_TABLET | Freq: Every day | ORAL | 3 refills | Status: DC

## 2021-04-19 NOTE — Assessment & Plan Note (Signed)
Describes sinus pressure headache.  Recommend start flonase OTC + nasal saline irrigation, hold for epistaxis.  Update if not improving with treatment.

## 2021-04-19 NOTE — Assessment & Plan Note (Signed)
Chronic, overall stable on hyzaar - continue I asked her to continue monitoring BP at home and let me know if consistently elevated to titrate BP med.

## 2021-04-19 NOTE — Patient Instructions (Signed)
Labs today.  He rellenado medicina para presion arterial - siga monitoreando en casa y me deja saber si anda elevada la presion consistentemente >140/90. Para dolor de cabeza - creo que viene de congestion nasal. Comeinze flonase corticosteroide nasal 2 sprays en cada nariz una vez al dia. Pare de usar si sangra por la Lawyer.  Tambien puede usar salina nasal ([place]). Dejenos saber si no mejora con esto. Regresar en 6 meses para examen fisico.

## 2021-04-19 NOTE — Progress Notes (Signed)
Patient ID: Amy [name], female    DOB: 1971/05/14, 50 y.o.   MRN: [id number]  This visit was conducted in person.  BP 120/74 (BP Location: Left Arm, Patient Position: Sitting, Cuff Size: Normal)   Pulse 63   Temp (!) 97.2 F (36.2 C) (Temporal)   Ht 5' 4.75" (1.645 m)   Wt 166 lb (75.3 kg)   LMP 10/06/2014   SpO2 99%   BMI 27.84 kg/m    CC: CPE - pt requests converted to med refill visit  Subjective:   HPI: Amy [name] is a 50 y.o. female presenting on 04/19/2021 for Annual Exam (And same day labs)   On vacation this week.   HTN - Compliant with current antihypertensive regimen of hyzaar 50/12.5mg  daily. Does check blood pressures at home: 150/90s, today 127/98. No longer walking regularly. No low blood pressure readings or symptoms of dizziness/syncope. Denies vision changes, CP/tightness, SOB, leg swelling. Notes recent headache - see below.   Headache described as head pressure to nose, frontal sinuses, with radiation into head. Sinus headache medicines help. + allergic symptoms - itchy watery eyes, runny nose. Intermittent claritin.   Upcoming eye doctor appt 05/2021.   Preventative: Colonoscopy - normal 2010 [facility]).  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 Interim medical history since our last visit reviewed. Allergies and medications reviewed and updated. Outpatient Medications Prior to Visit  Medication Sig Dispense Refill   Ascorbic Acid (VITAMIN C) 100 MG tablet Take 100 mg by mouth daily.     Biotin 5 MG CAPS Take 1 capsule by mouth daily.     cyanocobalamin (V-R VITAMIN B-12) 500 MCG tablet Take 1  tablet (500 mcg total) by mouth daily.     glucose blood (RELION GLUCOSE TEST STRIPS) test strip Use as instructed 100 each 12   Multiple Vitamins-Minerals (ZINC PO) Take by mouth daily.     Cholecalciferol (VITAMIN D3 PO) Take by mouth daily.     losartan-hydrochlorothiazide (HYZAAR) 50-12.5 MG tablet Take 1 tablet by mouth once daily 90 tablet 0   No facility-administered medications prior to visit.     Per HPI unless specifically indicated in ROS section below Review of Systems  Objective:  BP 120/74 (BP Location: Left Arm, Patient Position: Sitting, Cuff Size: Normal)   Pulse 63   Temp (!) 97.2 F (36.2 C) (Temporal)   Ht 5' 4.75" (1.645 m)   Wt 166 lb (75.3 kg)   LMP 10/06/2014   SpO2 99%   BMI 27.84 kg/m   Wt Readings from Last 3 Encounters:  04/19/21 166 lb (75.3 kg)  10/10/20 181 lb 1 oz (82.1 kg)  11/30/19 170 lb (77.1 kg)      Physical Exam Vitals and nursing note reviewed.  Constitutional:      Appearance: Normal appearance. She is not ill-appearing.  HENT:     Nose: Congestion present. No rhinorrhea.     Right Turbinates: Not enlarged, swollen or pale.     Left Turbinates: Not enlarged, swollen or pale.     Right Sinus: Maxillary sinus tenderness (mild) present. No frontal sinus tenderness.  Left Sinus: Maxillary sinus tenderness (mild) present. No frontal sinus tenderness.  Cardiovascular:     Rate and Rhythm: Normal rate and regular rhythm.     Pulses: Normal pulses.     Heart sounds: Normal heart sounds. No murmur heard. Pulmonary:     Effort: Pulmonary effort is normal. No respiratory distress.     Breath sounds: Normal breath sounds. No wheezing, rhonchi or rales.  Musculoskeletal:     Right lower leg: No edema.     Left lower leg: No edema.  Neurological:     Mental Status: She is alert.  Psychiatric:        Mood and Affect: Mood normal.        Behavior: Behavior normal.      Results for orders placed or performed in visit on 10/10/20   POCT glycosylated hemoglobin (Hb A1C)  Result Value Ref Range   Hemoglobin A1C 5.7 (A) 4.0 - 5.6 %   HbA1c POC (<> result, manual entry)     HbA1c, POC (prediabetic range)     HbA1c, POC (controlled diabetic range)      Assessment & Plan:  This visit occurred during the SARS-CoV-2 public health emergency.  Safety protocols were in place, including screening questions prior to the visit, additional usage of staff PPE, and extensive cleaning of exam room while observing appropriate contact time as indicated for disinfecting solutions.   Problem List Items Addressed This Visit     Prediabetes    Update A1c.        Essential hypertension - Primary    Chronic, overall stable on hyzaar - continue I asked her to continue monitoring BP at home and let me know if consistently elevated to titrate BP med.        Relevant Medications   losartan-hydrochlorothiazide (HYZAAR) 50-12.5 MG tablet   Sinus headache    Describes sinus pressure headache.  Recommend start flonase OTC + nasal saline irrigation, hold for epistaxis.  Update if not improving with treatment.          Meds ordered this encounter  Medications   losartan-hydrochlorothiazide (HYZAAR) 50-12.5 MG tablet    Sig: Take 1 tablet by mouth daily.    Dispense:  90 tablet    Refill:  3   Cholecalciferol (VITAMIN D3) 25 MCG (1000 UT) CAPS    Sig: Take 1 capsule (1,000 Units total) by mouth daily.    Dispense:  30 capsule   No orders of the defined types were placed in this encounter.   Patient Instructions  Labs today.  He rellenado medicina para presion arterial - siga monitoreando en casa y me deja saber si anda elevada la presion consistentemente >140/90. Para dolor de cabeza - creo que viene de congestion nasal. Comeinze flonase corticosteroide nasal 2 sprays en cada nariz una vez al dia. Pare de usar si sangra por la Lawyer.  Tambien puede usar salina nasal ([place]). Dejenos saber si no mejora con esto. Regresar en 6  meses para examen fisico.   Follow up plan: Return in about 6 months (around 10/20/2021), or if symptoms worsen or fail to improve, for annual exam, prior fasting for blood work.  Ria Bush, MD

## 2021-04-19 NOTE — Assessment & Plan Note (Signed)
Update A1c ?

## 2021-04-20 ENCOUNTER — Encounter: Payer: Self-pay | Admitting: Family Medicine

## 2021-04-20 DIAGNOSIS — E785 Hyperlipidemia, unspecified: Secondary | ICD-10-CM | POA: Insufficient documentation

## 2021-04-20 DIAGNOSIS — E1169 Type 2 diabetes mellitus with other specified complication: Secondary | ICD-10-CM | POA: Insufficient documentation

## 2021-04-20 LAB — LIPID PANEL
Cholesterol: 216 mg/dL — ABNORMAL HIGH (ref <200)
HDL: 51 mg/dL (ref >=50)
LDL Cholesterol (Calc): 131 mg/dL (calc) — ABNORMAL HIGH
Non-HDL Cholesterol (Calc): 165 mg/dL (calc) — ABNORMAL HIGH (ref <130)
Total CHOL/HDL Ratio: 4.2 (calc) (ref <5.0)
Triglycerides: 200 mg/dL — ABNORMAL HIGH (ref <150)

## 2021-04-20 LAB — BASIC METABOLIC PANEL
BUN: 16 mg/dL (ref 7–25)
CO2: 23 mmol/L (ref 20–32)
Calcium: 9.9 mg/dL (ref 8.6–10.2)
Chloride: 104 mmol/L (ref 98–110)
Creat: 0.7 mg/dL (ref 0.50–1.10)
Glucose, Bld: 106 mg/dL — ABNORMAL HIGH (ref 65–99)
Potassium: 4.2 mmol/L (ref 3.5–5.3)
Sodium: 138 mmol/L (ref 135–146)

## 2021-04-20 LAB — HEMOGLOBIN A1C
Hgb A1c MFr Bld: 5.4 % of total Hgb (ref <5.7)
Mean Plasma Glucose: 108 mg/dL
eAG (mmol/L): 6 mmol/L

## 2021-06-05 ENCOUNTER — Encounter: Payer: Self-pay | Admitting: Family Medicine

## 2021-06-05 DIAGNOSIS — D3131 Benign neoplasm of right choroid: Secondary | ICD-10-CM | POA: Insufficient documentation

## 2021-06-12 ENCOUNTER — Other Ambulatory Visit: Payer: Self-pay

## 2021-06-12 ENCOUNTER — Ambulatory Visit (INDEPENDENT_AMBULATORY_CARE_PROVIDER_SITE_OTHER): Payer: Self-pay | Admitting: Nurse Practitioner

## 2021-06-12 ENCOUNTER — Encounter: Payer: Self-pay | Admitting: Nurse Practitioner

## 2021-06-12 VITALS — BP 144/84 | Ht 64.0 in | Wt 174.0 lb

## 2021-06-12 DIAGNOSIS — Z01419 Encounter for gynecological examination (general) (routine) without abnormal findings: Secondary | ICD-10-CM

## 2021-06-12 DIAGNOSIS — N951 Menopausal and female climacteric states: Secondary | ICD-10-CM

## 2021-06-12 DIAGNOSIS — Z780 Asymptomatic menopausal state: Secondary | ICD-10-CM

## 2021-06-12 DIAGNOSIS — R1030 Lower abdominal pain, unspecified: Secondary | ICD-10-CM

## 2021-06-12 NOTE — Progress Notes (Signed)
Amy [name] 12-05-1970 [id number]   History:  50 y.o. G4P0013 presents for annual exam. Postmenopausal - no HRT, no bleeding. POI at age 42/40. Complains of vaginal dryness and pain with intercourse. She also has had intermittent mid lower abdominal pain x 2 months. Pain occurs most days, describes it as a discomfort, nothing makes it worse and nothing makes it better. No changes in bowel habits. Normal pap and mammogram history. HTN, T2DM managed by PCP.   Gynecologic History Patient's last menstrual period was 10/06/2014.   Contraception/Family planning: post menopausal status Sexually active: Yes  Health Maintenance Last Pap: 03/30/2019. Results were: Normal, 3 year repeat Last mammogram: 12/19/2016. Results were: Normal Last colonoscopy: 01/17/2018 Last Dexa: Not indicated  Past medical history, past surgical history, family history and social history were all reviewed and documented in the EPIC chart. Married. 3 children, 3 grandchildren ages 40, 10, and 30.   ROS:  A ROS was performed and pertinent positives and negatives are included.  Exam:  Vitals:   06/12/21 1500  BP: (!) 144/84  Weight: 174 lb (78.9 kg)  Height: '5\' 4"'$  (1.626 m)   Body mass index is 29.87 kg/m.  General appearance:  Normal Thyroid:  Symmetrical, normal in size, without palpable masses or nodularity. Respiratory  Auscultation:  Clear without wheezing or rhonchi Cardiovascular  Auscultation:  Regular rate, without rubs, murmurs or gallops  Edema/varicosities:  Not grossly evident Abdominal  Soft, tender with palpation in mid lower abdomen, mild guarding. Without masses or rebound.  Liver/spleen:  No organomegaly noted  Hernia:  None appreciated  Skin  Inspection:  Grossly normal Breasts: Examined lying and sitting.   Right: Without masses, retractions, nipple discharge or axillary adenopathy.   Left: Without masses, retractions, nipple discharge or axillary adenopathy. Genitourinary    Inguinal/mons:  Normal without inguinal adenopathy  External genitalia:  Normal appearing vulva with no masses, tenderness, or lesions  BUS/Urethra/Skene's glands:  Normal  Vagina: Atrophic changes  Cervix:  Normal appearing without discharge or lesions  Uterus:  Normal in size, shape and contour.  Midline and mobile, nontender  Adnexa/parametria:     Rt: Normal in size, without masses or tenderness.   Lt: Normal in size, without masses or tenderness.  Anus and perineum: Normal  Digital rectal exam: Normal sphincter tone without palpated masses or tenderness  Patient informed chaperone available to be present for breast and pelvic exam. Patient has requested no chaperone to be present. Patient has been advised what will be completed during breast and pelvic exam.   Assessment/Plan:  50 y.o. [id number] for annual exam.   Well female exam with routine gynecological exam - Education provided on SBEs, importance of preventative screenings, current guidelines, high calcium diet, regular exercise, and multivitamin daily. Labs with PCP.   Postmenopausal - no HRT, no bleeding.   Menopausal vaginal dryness - discussed management with OTC lubricants, oil-based lubricants, and vaginal estrogen. She is interested in vaginal estrogen but is worried about costs. Will send prescription for compounded vaginal estrogen to use twice weekly. Also recommend using coconut oil with intercourse.   Lower abdominal pain - Plan: US PELVIS TRANSVAGINAL NON-OB (TV ONLY). She also has had intermittent mid lower abdominal pain x 2 months. Pain occurs most days, describes it as a discomfort, nothing makes it worse and nothing makes it better. No changes in bowel habits.   Screening for cervical cancer - Normal Pap history.  Will repeat at 3-year interval per guidelines.  Screening for breast  cancer - Overdue for mammogram. Discussed current guidelines and importance of preventative screenings. Information provided on [facility] and encouraged to schedule soon.  Normal breast exam today.  Screening for colon cancer - 2019 colonoscopy. Will repeat at GI's recommended interval.   Screening for osteoporosis - Menopause at age [age]. Has never been on HRT. Recommend baseline Dexa.   Return in 1 year for annual.   Tamela Gammon DNP, 3:54 PM 06/12/2021

## 2021-06-13 ENCOUNTER — Telehealth: Payer: Self-pay | Admitting: *Deleted

## 2021-06-13 MED ORDER — ESTRADIOL 0.1 MG/GM VA CREA: TOPICAL_CREAM | VAGINAL | 1 refills | Status: DC

## 2021-06-13 NOTE — Telephone Encounter (Signed)
Yes, that is fine. Please send to patient's normal pharmacy and please let patient know we have changed and what the expected cost will be. Thank you.

## 2021-06-13 NOTE — Telephone Encounter (Signed)
Pharmacy stated they don't compound the estradiol cream 0.02% anymore they have  Estrace 0.01% commercially available $35. This can be sent to any pharmacy. I will send this message to Amy Lowenstein NP for advice/recommendations.

## 2021-06-13 NOTE — Telephone Encounter (Signed)
Just to verify what I found in Epic is Estrace 0.1 mg/gram . I will ask Tiffany NP what the directions would be for the patient.

## 2021-06-13 NOTE — Telephone Encounter (Signed)
-----   Message from Tamela Gammon, NP sent at 06/12/2021  4:00 PM EDT ----- Please send prescription for compounded vaginal estradiol cream 0.02%  - 1 gram twice weekly to [facility].

## 2021-06-13 NOTE — Telephone Encounter (Signed)
Yes with instructions to insert 1 gram twice weekly.

## 2021-06-14 NOTE — Telephone Encounter (Signed)
Medication sent to pharmacy  

## 2021-08-31 ENCOUNTER — Ambulatory Visit (INDEPENDENT_AMBULATORY_CARE_PROVIDER_SITE_OTHER): Payer: Self-pay | Admitting: Obstetrics and Gynecology

## 2021-08-31 ENCOUNTER — Ambulatory Visit (INDEPENDENT_AMBULATORY_CARE_PROVIDER_SITE_OTHER): Payer: Self-pay

## 2021-08-31 ENCOUNTER — Other Ambulatory Visit: Payer: Self-pay

## 2021-08-31 ENCOUNTER — Encounter: Payer: Self-pay | Admitting: Obstetrics and Gynecology

## 2021-08-31 VITALS — BP 130/80 | HR 72 | Ht 64.0 in | Wt 173.0 lb

## 2021-08-31 DIAGNOSIS — R1030 Lower abdominal pain, unspecified: Secondary | ICD-10-CM

## 2021-08-31 DIAGNOSIS — N941 Unspecified dyspareunia: Secondary | ICD-10-CM

## 2021-08-31 DIAGNOSIS — N952 Postmenopausal atrophic vaginitis: Secondary | ICD-10-CM

## 2021-08-31 DIAGNOSIS — R9389 Abnormal findings on diagnostic imaging of other specified body structures: Secondary | ICD-10-CM

## 2021-08-31 MED ORDER — ESTRADIOL 0.1 MG/GM VA CREA: TOPICAL_CREAM | VAGINAL | 1 refills | Status: DC

## 2021-08-31 NOTE — Patient Instructions (Signed)
Try uberlube for vaginal lubrication.  Atrophic Vaginitis Atrophic vaginitis is a condition in which the tissues that line the vagina become dry and thin. This condition is most common in women who have stopped having regular menstrual periods (are in menopause). This usually starts when a woman is 4 to 48 years old. That is the time when a woman's estrogen levels begin to decrease. Estrogen is a female hormone. It helps to keep the tissues of the vagina moist. It stimulates the vagina to produce a clear fluid that lubricates the vagina for sex. This fluid also protects the vagina from infection. Lack of estrogen can cause the lining of the vagina to get thinner and dryer. The vagina may also shrink in size. It may become less elastic. Atrophic vaginitis tends to get worse over time as a woman's estrogen level drops. What are the causes? This condition is caused by the normal drop in estrogen that happens around the time of menopause. What increases the risk? Certain conditions or situations may lower a woman's estrogen level, leading to a higher risk for atrophic vaginitis. You are more likely to develop this condition if: You are taking medicines that block estrogen. You have had your ovaries removed. You are being treated for cancer with radiation or medicines (chemotherapy). You have given birth or are breastfeeding. You are older than age 48. You smoke. What are the signs or symptoms? Symptoms of this condition include: Pain, soreness, a feeling of pressure, or bleeding during sex (dyspareunia). Vaginal burning, irritation, or itching. Pain or bleeding when a speculum is used in a vaginal exam. Having burning pain while urinating. Vaginal discharge. In some cases, there are no symptoms. How is this diagnosed? This condition is diagnosed based on your medical history and a physical exam. This will include a pelvic exam that checks the vaginal tissues. Though rare, you may also have other  tests, including: A urine test. A test that checks the acid balance in your vagina (acid balance test). How is this treated? Treatment for this condition depends on how severe your symptoms are. Treatment may include: Using an over-the-counter vaginal lubricant before sex. Using a long-acting vaginal moisturizer. Using low-dose estrogen for moderate to severe symptoms that do not respond to other treatments. Options include creams, tablets, and inserts (vaginal rings). Before you use a vaginal estrogen, tell your health care provider if you have a history of: Breast cancer. Endometrial cancer. Blood clots. If you are not sexually active and your symptoms are very mild, you may not need treatment. Follow these instructions at home: Medicines Take over-the-counter and prescription medicines only as told by your health care provider. Do not use herbal or alternative medicines unless your health care provider says that you can. Use over-the-counter creams, lubricants, or moisturizers for dryness only as told by your health care provider. General instructions If your atrophic vaginitis is caused by menopause, discuss all of your menopause symptoms and treatment options with your health care provider. Do not douche. Do not use products that can make your vagina dry. These include: Scented feminine sprays. Scented tampons. Scented soaps. Vaginal sex can help to improve blood flow and elasticity of vaginal tissue. If you choose to have sex and it hurts, try using a water-soluble lubricant or moisturizer right before having sex. Contact a health care provider if: Your discharge looks different than normal. Your vagina has an unusual smell. You have new symptoms. Your symptoms do not improve with treatment. Your symptoms get worse. Summary  Atrophic vaginitis is a condition in which the tissues that line the vagina become dry and thin. It is most common in women who have stopped having regular  menstrual periods (are in menopause). Treatment options include using vaginal lubricants and low-dose vaginal estrogen. Contact a health care provider if your vagina has an unusual smell, or if your symptoms get worse or do not improve after treatment. This information is not intended to replace advice given to you by your health care provider. Make sure you discuss any questions you have with your health care provider. Document Revised: 03/31/2020 Document Reviewed: 03/31/2020 Elsevier Patient Education  [employer].

## 2021-08-31 NOTE — Progress Notes (Addendum)
GYNECOLOGY  VISIT   HPI: 50 y.o.   Married White or Caucasian Hispanic or Latino  female   [phone] with Patient's last menstrual period was 10/06/2014.   here for evaluation of lower abdominal pain. The pain is in the SP region. Seems a little better recently.  The biggest issues is pain with sex. It hurts on entry. She uses coconut oil, helps a little.  She is PMP without bleeding, not on HRT, FSH was elevated in 2016.  Marny Lowenstein, NP sent in a script for estrace cream. She never started it secondary to the expense.   GYNECOLOGIC HISTORY: Patient's last menstrual period was 10/06/2014. 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   Dyslipidemia 04/20/2021   Sinus headache 04/19/2021   Overweight (BMI 25.0-29.9) 12/29/2020   Head injury due to trauma 05/12/2017   Vitamin D deficiency    Fibromyalgia    Early menopause occurring in patient age younger than 35 years 11/30/2016   Premature ovarian failure 10/15/2016   Hemorrhoids 08/22/2009   Left sided abdominal pain 08/22/2009   Irritable bowel syndrome (IBS) 04/14/2008   Prediabetes 04/14/2007   MDD (major depressive disorder), recurrent episode, moderate ([place]) 04/14/2007   Essential hypertension 04/14/2007   ALLERGIC RHINITIS 04/14/2007   GERD 04/14/2007   Osteopenia 04/14/2007   NEPHROLITHIASIS, HX OF 04/14/2007    Past Medical History:  Diagnosis Date   Diabetes mellitus without complication ([place])    Elevated cholesterol    Fibromyalgia    Hypertension    IBS (irritable bowel syndrome)    Missed abortion    Premature ovarian failure 2014   not on HRT   Vitamin D deficiency     Past Surgical History:   Procedure Laterality Date   CESAREAN SECTION     CHOLECYSTECTOMY  2004   COLONOSCOPY  10/15/2008   WNL, IBS Carlean Purl)   COLONOSCOPY  01/2018   proximal sigmoid colon inflammation biopsy negative ([place])   ESOPHAGOGASTRODUODENOSCOPY  01/2018   chronic inactive gastritis, neg H pylori (Dr Penelope Coop)    Current Outpatient Medications  Medication Sig Dispense Refill   Ascorbic Acid (VITAMIN C) 100 MG tablet Take 100 mg by mouth daily.     Biotin 5 MG CAPS Take 1 capsule by mouth daily.     Cholecalciferol (VITAMIN D3) 25 MCG (1000 UT) CAPS Take 1 capsule (1,000 Units total) by mouth daily. 30 capsule    cyanocobalamin (V-R VITAMIN B-12) 500 MCG tablet Take 1 tablet (500 mcg total) by mouth daily.     estradiol (ESTRACE) 0.1 MG/GM vaginal cream insert 1 gram twice weekly 42.5 g 1   glucose blood (RELION GLUCOSE TEST STRIPS) test strip Use as instructed 100 each 12   losartan-hydrochlorothiazide (HYZAAR) 50-12.5 MG tablet Take 1 tablet by mouth daily. 90 tablet 3   Multiple Vitamins-Minerals (ZINC PO) Take by mouth daily.  No current facility-administered medications for this visit.     ALLERGIES: Patient has no known allergies.  Family History  Problem Relation Age of Onset   Diabetes Mother    Hypertension Mother    Heart disease Mother    Cancer Father        brain tumor   Diabetes Paternal Grandfather    Stroke Neg Hx     Social History   Socioeconomic History   Marital status: Married    Spouse name: Not on file   Number of children: Not on file   Years of education: Not on file   Highest education level: Not on file  Occupational History   Not on file  Tobacco Use   Smoking status: Former   Smokeless tobacco: Never  Vaping Use   Vaping Use: Never used  Substance and Sexual Activity   Alcohol use: Yes    Comment: rare   Drug use: No   Sexual activity: Yes    Birth control/protection: Post-menopausal    Comment: 1st intercourse 50yo-Fewer than 5 partners   Other Topics Concern   Not on file  Social History Narrative   Spanish speaking   From [place]   Lives with husband, mother and son (2000) and daughter (2003)   Social Determinants of Radio broadcast assistant Strain: Not on Art therapist Insecurity: Not on file  Transportation Needs: Not on file  Physical Activity: Not on file  Stress: Not on file  Social Connections: Not on file  Intimate Partner Violence: Not on file    Review of Systems  All other systems reviewed and are negative.  Pelvic ultrasound  Indications: lower abdominal pain  Findings:  Anteverted Uterus 7.24 x 4.88 x 3.95 cm  Endometrium 8.28 mm, symmetrical, no masses, avascular.  Left ovary 2.06 x 1.20 x 1.43 cm  Right ovary 2.40 x 1.46 x 1.40 cm  No free fluid   Impression:    PHYSICAL EXAMINATION:    BP 130/80   Pulse 72   Ht 5\' 4"  (1.626 m)   Wt 173 lb (78.5 kg)   LMP 10/06/2014   SpO2 99%   BMI 29.70 kg/m     General appearance: alert, cooperative and appears stated age  33. Lower abdominal pain Currently tolerable. No findings on ultrasound to explain her pain.  2. Thickened endometrium 8 mm, no vaginal bleeding. Call with any bleeding.  3. Vaginal atrophy She never tried vaginal estrogen secondary to expense.  Will try again, will give good RX coupon - estradiol (ESTRACE) 0.1 MG/GM vaginal cream; insert 1 gram vaginally every night for one week, then twice weekly at bedtime.  Dispense: 42.5 g; Refill: 1 -Use uberlube for lubrication  4. Dyspareunia, female - estradiol (ESTRACE) 0.1 MG/GM vaginal cream; insert 1 gram vaginally every night for one week, then twice weekly at bedtime.  Dispense: 42.5 g; Refill: 1 -use lubrication, she should control rate and depth of penetration.   In addition to reviewing the ultrasound and results of the ultrasound, over 10 minutes was spent in counseling on vaginal atrophy and dyspareunia.   Addendum: Ultrasound impression: Normal  sized, anteverted uterus Normal adnexa bilaterally. Endometrium of 8 mm, symmetrical, no masses. Thickened for a PMP female. No bleeding.  Current recommendations are to biopsy an endometrium >= 11 mm in an asymptomatic PMP woman. She will call with any bleeding for further evaluation.

## 2021-10-12 ENCOUNTER — Other Ambulatory Visit: Payer: Self-pay | Admitting: Obstetrics and Gynecology

## 2021-10-12 ENCOUNTER — Other Ambulatory Visit: Payer: Self-pay

## 2021-11-13 ENCOUNTER — Encounter: Payer: Self-pay | Admitting: Physician Assistant

## 2021-11-23 ENCOUNTER — Ambulatory Visit: Payer: Self-pay | Admitting: Physician Assistant

## 2021-11-23 ENCOUNTER — Encounter: Payer: Self-pay | Admitting: Physician Assistant

## 2021-11-23 VITALS — BP 128/70 | HR 90 | Ht 64.0 in | Wt 184.0 lb

## 2021-11-23 DIAGNOSIS — K219 Gastro-esophageal reflux disease without esophagitis: Secondary | ICD-10-CM

## 2021-11-23 DIAGNOSIS — K582 Mixed irritable bowel syndrome: Secondary | ICD-10-CM

## 2021-11-23 DIAGNOSIS — K209 Esophagitis, unspecified without bleeding: Secondary | ICD-10-CM

## 2021-11-23 DIAGNOSIS — R1013 Epigastric pain: Secondary | ICD-10-CM

## 2021-11-23 MED ORDER — OMEPRAZOLE 40 MG PO CPDR: 40.0000 mg | DELAYED_RELEASE_CAPSULE | Freq: Every day | ORAL | 4 refills | Status: DC

## 2021-11-23 MED ORDER — GLYCOPYRROLATE 2 MG PO TABS: ORAL_TABLET | ORAL | 6 refills | Status: DC

## 2021-11-23 NOTE — Patient Instructions (Addendum)
If you are age 49 or younger, your body mass index should be between 19-25. Your Body mass index is 31.58 kg/m. If this is out of the aformentioned range listed, please consider follow up with your Primary Care Provider.  ________________________________________________________  The [facility] providers would like to encourage you to use [facility] to communicate with providers for non-urgent requests or questions.  Due to long hold times on the telephone, sending your provider a message by [facility] may be a faster and more efficient way to get a response.  Please allow 48 business hours for a response.  Please remember that this is for non-urgent requests.  _______________________________________________________  START Omeprazole 40 mg 1 capsule before breakfast daily START Glycopyrrolate 2 mg tablet every morning as needed for abdominal pain/spasms.  Follow GERD Diet  Stop eating bread  Call or message the office to talk with Beth in 3 weeks if you are not feeling better.  Thank you for entrusting me with your care and choosing [facility].  Amy Esterwood, PA-C

## 2021-11-23 NOTE — Progress Notes (Signed)
Subjective:    Patient ID: Amy [name], female    DOB: [birth year], 51 y.o.   MRN: [id number]  HPI [name] is a 51 year old female, established here remotely with Dr. Carlean Purl and last seen in 2010. She had more recently been seen by Dr. Jessie Foot GI and had undergone colonoscopy in April 2019 for abdominal pain.  She was found to have some localized inflammation in the proximal sigmoid colon but otherwise had a negative exam.  She had random biopsies done which were unremarkable and biopsies from the sigmoid colon showed benign colonic mucosa with prolapse related injury, no dysplasia.  Also underwent EGD at that same time which showed mild gastric erythema, biopsies showed chronic inactive gastritis no H. pylori.  She also had CT of the abdomen done in 2019 per Dr. Penelope Coop which is negative other than mild hepatic steatosis. Today she says she was not sure which practice she had been at previously but made the appointment because she is having similar symptoms to what she experienced in 2019.  She has having discomfort in the left upper quadrant over the past few months which she says has been fairly constant.  She is also having irritation and discomfort with swallowing solids and liquids and says she can feel them going down, then hit her stomach.  She does intermittently get the sensation that food may be sticking for short period of time.  No nausea or vomiting.  No current complaints of diarrhea, melena or constipation. She says she has been doing intermittent fasting since 2019 and had lost about 70 pounds in total.  Her job changed a bit this past summer and she has been doing a lot more sitting and had gained about 20 pounds back.  She has changed her regimen and is trying to lose a bit of weight.  She says she had been going for long periods without eating but was drinking some black coffee and wonders if this irritated her stomach.  No regular aspirin or NSAID use.  She is also having some  ongoing discomfort which is somewhat migratory, she feels that greasy foods make this worse and also certain types of bread cause her stomach to hurt after eating.  Interestingly she tells me that she has been eating a small amount of a different sort of bread for breakfast each morning with an egg in it that has not bothered her stomach.  Review of Systems. Pertinent positive and negative review of systems were noted in the above HPI section.  All other review of systems was otherwise negative.   Outpatient Encounter Medications as of 11/23/2021  Medication Sig   Ascorbic Acid (VITAMIN C) 100 MG tablet Take 100 mg by mouth daily.   Biotin 5 MG CAPS Take 1 capsule by mouth daily.   Cholecalciferol (VITAMIN D3) 25 MCG (1000 UT) CAPS Take 1 capsule (1,000 Units total) by mouth daily.   cyanocobalamin (V-R VITAMIN B-12) 500 MCG tablet Take 1 tablet (500 mcg total) by mouth daily.   estradiol (ESTRACE) 0.1 MG/GM vaginal cream insert 1 gram vaginally every night for one week, then twice weekly at bedtime.   glycopyrrolate (ROBINUL) 2 MG tablet Take 1 tablet every morning for spasms/ abdominal pain   losartan-hydrochlorothiazide (HYZAAR) 50-12.5 MG tablet Take 1 tablet by mouth daily.   Multiple Vitamins-Minerals (ZINC PO) Take by mouth daily.   omeprazole (PRILOSEC) 40 MG capsule Take 1 capsule (40 mg total) by mouth daily before breakfast.   [DISCONTINUED] glucose blood (  RELION GLUCOSE TEST STRIPS) test strip Use as instructed   No facility-administered encounter medications on file as of 11/23/2021.   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   Dyslipidemia 04/20/2021   Sinus headache 04/19/2021   Overweight (BMI 25.0-29.9) 12/29/2020   Head injury due to trauma 05/12/2017   Vitamin D deficiency    Fibromyalgia    Early menopause occurring in patient age younger than 70 years 11/30/2016   Premature ovarian failure 10/15/2016    Hemorrhoids 08/22/2009   Left sided abdominal pain 08/22/2009   Irritable bowel syndrome (IBS) 04/14/2008   Prediabetes 04/14/2007   MDD (major depressive disorder), recurrent episode, moderate ([place]) 04/14/2007   Essential hypertension 04/14/2007   ALLERGIC RHINITIS 04/14/2007   GERD 04/14/2007   Osteopenia 04/14/2007   NEPHROLITHIASIS, HX OF 04/14/2007   Social History   Socioeconomic History   Marital status: Married    Spouse name: Not on file   Number of children: Not on file   Years of education: Not on file   Highest education level: Not on file  Occupational History   Not on file  Tobacco Use   Smoking status: Former   Smokeless tobacco: Never  Vaping Use   Vaping Use: Never used  Substance and Sexual Activity   Alcohol use: Yes    Comment: rare   Drug use: No   Sexual activity: Yes    Birth control/protection: Post-menopausal    Comment: 1st intercourse 51yo-Fewer than 5 partners  Other Topics Concern   Not on file  Social History Narrative   Spanish speaking   From [place]   Lives with husband, mother and son (2000) and daughter (2003)   Social Determinants of Radio broadcast assistant Strain: Not on file  Food Insecurity: Not on file  Transportation Needs: Not on file  Physical Activity: Not on file  Stress: Not on file  Social Connections: Not on file  Intimate Partner Violence: Not on file    Ms. Zahner's family history includes Cancer in her father; Diabetes in her mother and paternal grandfather; Heart disease in her mother; Hypertension in her mother.      Objective:    Vitals:   11/23/21 1452  BP: 128/70  Pulse: 90  SpO2: 96%    Physical Exam Well-developed well-nourished Hispanic female in no acute distress.  Pleasant Weight, 184 BMI 31.5  HEENT; nontraumatic normocephalic, EOMI, PE R LA, sclera anicteric. Oropharynx; not examined today Neck; supple, no JVD Cardiovascular; regular rate and rhythm with S1-S2, no murmur rub  or gallop Pulmonary; Clear bilaterally Abdomen; soft, there is some mild tenderness across the epigastrium, and very mild tenderness bilateral lower quadrants no guarding or rebound no palpable mass or hepatosplenomegaly, bowel sounds are active Rectal; not done today Skin; benign exam, no jaundice rash or appreciable lesions Extremities; no clubbing cyanosis or edema skin warm and dry Neuro/Psych; alert and oriented x4, grossly nonfocal mood and affect appropriate        Assessment & Plan:   #57 51 year old Hispanic female with couple of month history of mild odynophagia/dysphagia, and epigastric/left upper quadrant discomfort. I suspect symptoms secondary to mild esophagitis, possible gastritis  Prior EGD 2019 elsewhere gastric biopsies showed chronic inactive gastritis no H. Pylori  Patient also has history of IBS and there is some overlap with underlying IBS symptoms  #2 migratory lower abdominal pains-consistent with IBS type symptoms #3 colon  cancer screening-up-to-date with colonoscopy April 2019 no polyps  #4 hypertension #5.  Fibromyalgia #6.  History of nephrolithiasis  Plan; We discussed diet, start GERD diet, avoid greasy foods, have asked her to avoid bread over the next few weeks and then reassess symptoms. Will start omeprazole 40 mg p.o. every morning AC breakfast Start trial of glycopyrrolate 2 mg p.o. every morning We discussed potential celiac testing, patient is self-pay and would like to try medications first which I think is reasonable as her symptoms related to bread are somewhat inconsistent I have asked her to call back in about 3 weeks if she has not had significant improvement in symptoms, and at that time can assess need for further diagnostic work-up, and office follow up     Alfredia Ferguson PA-C 11/23/2021   Cc: Ria Bush, MD

## 2022-04-16 ENCOUNTER — Encounter: Payer: Self-pay | Admitting: Family Medicine

## 2022-04-16 ENCOUNTER — Ambulatory Visit (INDEPENDENT_AMBULATORY_CARE_PROVIDER_SITE_OTHER): Payer: Self-pay | Admitting: Family Medicine

## 2022-04-16 VITALS — BP 140/76 | HR 73 | Temp 97.7°F | Ht 64.75 in | Wt 193.1 lb

## 2022-04-16 DIAGNOSIS — K582 Mixed irritable bowel syndrome: Secondary | ICD-10-CM

## 2022-04-16 DIAGNOSIS — E1169 Type 2 diabetes mellitus with other specified complication: Secondary | ICD-10-CM

## 2022-04-16 DIAGNOSIS — E785 Hyperlipidemia, unspecified: Secondary | ICD-10-CM

## 2022-04-16 DIAGNOSIS — E559 Vitamin D deficiency, unspecified: Secondary | ICD-10-CM

## 2022-04-16 DIAGNOSIS — E669 Obesity, unspecified: Secondary | ICD-10-CM

## 2022-04-16 DIAGNOSIS — M79672 Pain in left foot: Secondary | ICD-10-CM

## 2022-04-16 DIAGNOSIS — I10 Essential (primary) hypertension: Secondary | ICD-10-CM

## 2022-04-16 LAB — MICROALBUMIN / CREATININE URINE RATIO
Creatinine,U: 149.6 mg/dL
Microalb Creat Ratio: 2 mg/g (ref 0.0–30.0)
Microalb, Ur: 3 mg/dL — ABNORMAL HIGH (ref 0.0–1.9)

## 2022-04-16 LAB — LIPID PANEL
Cholesterol: 189 mg/dL (ref 0–200)
HDL: 47 mg/dL (ref >39.00)
LDL Cholesterol: 111 mg/dL — ABNORMAL HIGH (ref 0–99)
NonHDL: 141.99
Total CHOL/HDL Ratio: 4
Triglycerides: 153 mg/dL — ABNORMAL HIGH (ref 0.0–149.0)
VLDL: 30.6 mg/dL (ref 0.0–40.0)

## 2022-04-16 LAB — COMPREHENSIVE METABOLIC PANEL
ALT: 15 U/L (ref 0–35)
AST: 16 U/L (ref 0–37)
Albumin: 4.3 g/dL (ref 3.5–5.2)
Alkaline Phosphatase: 90 U/L (ref 39–117)
BUN: 13 mg/dL (ref 6–23)
CO2: 25 mEq/L (ref 19–32)
Calcium: 8.8 mg/dL (ref 8.4–10.5)
Chloride: 107 mEq/L (ref 96–112)
Creatinine, Ser: 0.54 mg/dL (ref 0.40–1.20)
GFR: 108.05 mL/min (ref >60.00)
Glucose, Bld: 103 mg/dL — ABNORMAL HIGH (ref 70–99)
Potassium: 3.7 mEq/L (ref 3.5–5.1)
Sodium: 140 mEq/L (ref 135–145)
Total Bilirubin: 0.5 mg/dL (ref 0.2–1.2)
Total Protein: 6.9 g/dL (ref 6.0–8.3)

## 2022-04-16 LAB — HEMOGLOBIN A1C: Hgb A1c MFr Bld: 6.5 % (ref 4.6–6.5)

## 2022-04-16 LAB — VITAMIN B12: Vitamin B-12: 336 pg/mL (ref 211–911)

## 2022-04-16 LAB — VITAMIN D 25 HYDROXY (VIT D DEFICIENCY, FRACTURES): VITD: 14.54 ng/mL — ABNORMAL LOW (ref 30.00–100.00)

## 2022-04-16 MED ORDER — METFORMIN HCL 500 MG PO TABS: ORAL_TABLET | ORAL | 2 refills | Status: DC

## 2022-04-16 MED ORDER — LOSARTAN POTASSIUM-HCTZ 100-12.5 MG PO TABS: 1.0000 | ORAL_TABLET | Freq: Every day | ORAL | 2 refills | Status: DC

## 2022-04-16 NOTE — Patient Instructions (Addendum)
Laboratorios hoy.  Trate plantilla (heel lift gel insert) para los zapatos para alivio de tendon de achilles. Si no mejora, dejeme saber para remision a podiatra.  Comienze metformina '500mg'$  diarios for 2 semanas luego suba dosis a dos veces al dia.  Debroah Baller dosis de losartan a 100/12.'5mg'$  diarios.  Regresar en 3 meses para proximo examen de diabetes.  Price out Dexcom vs [employer] continuous glucose monitors.

## 2022-04-16 NOTE — Progress Notes (Unsigned)
Patient ID: Amy [name], female    DOB: [birth year], 51 y.o.   MRN: [id number]  This visit was conducted in person.  BP 140/76   Pulse 73   Temp 97.7 F (36.5 C) (Temporal)   Ht 5' 4.75" (1.645 m)   Wt 193 lb 2 oz (87.6 kg)   LMP 10/06/2014   SpO2 97%   BMI 32.39 kg/m    CC: follow up visit  Subjective:   HPI: Amy [name] is a 51 y.o. female presenting on 04/16/2022 for Prediabetes (Here for f/u.)   She is fasting today Last seen 04/2021 - no insurance.  Last well woman exam 05/2021 with OBGYN.   Progressive weight gain over the past year, 27 lbs.  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   Worried about return of diabetes. Previously tolerated metformin well, stopped 2019 when she lost ~70lbs.  She's started checking fasting sugars 160s at home.  Notes some lightheadedness worse with standing.  Notes L heel pain and burning discomfort. No paresthesias or numbness, denies inciting trauma/injury. Notes worsening cramping to toes. No blurry vision.  Diabetic Foot Exam - Simple   Simple Foot Form Diabetic Foot exam was performed with the following findings: Yes 04/16/2022  1:56 PM  Visual Inspection No deformities, no ulcerations, no other skin breakdown bilaterally: Yes Sensation Testing Intact to touch and monofilament testing bilaterally: Yes Pulse Check Posterior Tibialis and Dorsalis pulse intact bilaterally: Yes Comments      Notes facial rash to breads -face breaks out. Also has abdominal discomfort with breads. Wonders about gluten sensitivity.   HTN - Compliant with current antihypertensive regimen of losartan hctz 50/12.'5mg'$  daily. Does check blood pressures at home: mostly running high 170/100s.  No low blood pressure readings or symptoms of dizziness/syncope.  Denies HA, vision changes, CP/tightness, SOB, leg swelling.      Relevant past medical, surgical, family and social history reviewed and updated as indicated. Interim  medical history since our last visit reviewed. Allergies and medications reviewed and updated. Outpatient Medications Prior to Visit  Medication Sig Dispense Refill   losartan-hydrochlorothiazide (HYZAAR) 50-12.5 MG tablet Take 1 tablet by mouth daily. 90 tablet 3   Ascorbic Acid (VITAMIN C) 100 MG tablet Take 100 mg by mouth daily. (Patient not taking: Reported on 04/16/2022)     Biotin 5 MG CAPS Take 1 capsule by mouth daily. (Patient not taking: Reported on 04/16/2022)     Cholecalciferol (VITAMIN D3) 25 MCG (1000 UT) CAPS Take 1 capsule (1,000 Units total) by mouth daily. (Patient not taking: Reported on 04/16/2022) 30 capsule    cyanocobalamin (V-R VITAMIN B-12) 500 MCG tablet Take 1 tablet (500 mcg total) by mouth daily. (Patient not taking: Reported on 04/16/2022)     Multiple Vitamins-Minerals (ZINC PO) Take by mouth daily. (Patient not taking: Reported on 04/16/2022)     estradiol (ESTRACE) 0.1 MG/GM vaginal cream insert 1 gram vaginally every night for one week, then twice weekly at bedtime. 42.5 g 1   glycopyrrolate (ROBINUL) 2 MG tablet Take 1 tablet every morning for spasms/ abdominal pain 30 tablet 6   omeprazole (PRILOSEC) 40 MG capsule Take 1 capsule (40 mg total) by mouth daily before breakfast. 30 capsule 4   No facility-administered medications prior to visit.     Per HPI unless specifically indicated in ROS section below Review of Systems  Objective:  BP 140/76   Pulse 73   Temp 97.7 F (36.5  C) (Temporal)   Ht 5' 4.75" (1.645 m)   Wt 193 lb 2 oz (87.6 kg)   LMP 10/06/2014   SpO2 97%   BMI 32.39 kg/m   Wt Readings from Last 3 Encounters:  04/16/22 193 lb 2 oz (87.6 kg)  11/23/21 184 lb (83.5 kg)  08/31/21 173 lb (78.5 kg)      Physical Exam Vitals and nursing note reviewed.  Constitutional:      Appearance: Normal appearance. She is not ill-appearing.  Eyes:     Extraocular Movements: Extraocular movements intact.     Conjunctiva/sclera: Conjunctivae normal.      Pupils: Pupils are equal, round, and reactive to light.  Cardiovascular:     Rate and Rhythm: Normal rate and regular rhythm.     Pulses: Normal pulses.     Heart sounds: Normal heart sounds. No murmur heard. Pulmonary:     Effort: Pulmonary effort is normal. No respiratory distress.     Breath sounds: Normal breath sounds. No wheezing, rhonchi or rales.  Musculoskeletal:        General: Tenderness present.     Right lower leg: No edema.     Left lower leg: No edema.     Comments: Tender to palpation at insertion of L achilles into heel, without haglund deformity  Skin:    General: Skin is warm and dry.     Findings: No rash.  Neurological:     Mental Status: She is alert.  Psychiatric:        Mood and Affect: Mood normal.        Behavior: Behavior normal.       Results for orders placed or performed in visit on 04/16/22  VITAMIN D 25 Hydroxy (Vit-D Deficiency, Fractures)  Result Value Ref Range   VITD 14.54 (L) 30.00 - 100.00 ng/mL  Lipid panel  Result Value Ref Range   Cholesterol 189 0 - 200 mg/dL   Triglycerides 153.0 (H) 0.0 - 149.0 mg/dL   HDL 47.00 >39.00 mg/dL   VLDL 30.6 0.0 - 40.0 mg/dL   LDL Cholesterol 111 (H) 0 - 99 mg/dL   Total CHOL/HDL Ratio 4    NonHDL 141.99   Comprehensive metabolic panel  Result Value Ref Range   Sodium 140 135 - 145 mEq/L   Potassium 3.7 3.5 - 5.1 mEq/L   Chloride 107 96 - 112 mEq/L   CO2 25 19 - 32 mEq/L   Glucose, Bld 103 (H) 70 - 99 mg/dL   BUN 13 6 - 23 mg/dL   Creatinine, Ser 0.54 0.40 - 1.20 mg/dL   Total Bilirubin 0.5 0.2 - 1.2 mg/dL   Alkaline Phosphatase 90 39 - 117 U/L   AST 16 0 - 37 U/L   ALT 15 0 - 35 U/L   Total Protein 6.9 6.0 - 8.3 g/dL   Albumin 4.3 3.5 - 5.2 g/dL   GFR 108.05 >60.00 mL/min   Calcium 8.8 8.4 - 10.5 mg/dL  Hemoglobin A1c  Result Value Ref Range   Hgb A1c MFr Bld 6.5 4.6 - 6.5 %  Microalbumin / creatinine urine ratio  Result Value Ref Range   Microalb, Ur 3.0 (H) 0.0 - 1.9 mg/dL    Creatinine,U 149.6 mg/dL   Microalb Creat Ratio 2.0 0.0 - 30.0 mg/g  Vitamin B12  Result Value Ref Range   Vitamin B-12 336 211 - 911 pg/mL    Assessment & Plan:   Problem List Items Addressed This Visit  Type 2 diabetes mellitus with other specified complication (HCC) - Primary    Deteriorated glycemic control in setting of significant weight gain over the past year.  A1c up to 6.5% - back in diet controlled diabetes.  Will start metformin '500mg'$  once daily. She will renew efforts towards low sugar low carb diet.  RTC 3 mo DM f/u visit       Relevant Medications   losartan-hydrochlorothiazide (HYZAAR) 100-12.5 MG tablet   metFORMIN (GLUCOPHAGE) 500 MG tablet   Other Relevant Orders   Hemoglobin A1c (Completed)   Microalbumin / creatinine urine ratio (Completed)   Vitamin B12 (Completed)   Essential hypertension    Chronic, however BP elevation noted at home with weight gain.  Will increase hyzaar from 50/12.'5mg'$  to 100/12.'5mg'$  daily.  She will continue monitoring BP at home.       Relevant Medications   losartan-hydrochlorothiazide (HYZAAR) 100-12.5 MG tablet   Irritable bowel syndrome (IBS)    Notes intolerance to breads - consider celiac testing.       Vitamin D deficiency    Update levels - she's not been supplementing.       Relevant Orders   VITAMIN D 25 Hydroxy (Vit-D Deficiency, Fractures) (Completed)   Obesity, Class I, BMI 30-34.9    Discussed weight gain noted in setting of work promotion to more sedentary job, encouraged healthy diet and lifestyle choices to affect sustainable weight loss.       Dyslipidemia associated with type 2 diabetes mellitus ([place])    Update FLP as she's fasting.  The 10-year ASCVD risk score (Arnett DK, et al., 2019) is: 4%   Values used to calculate the score:     Age: 56 years     Sex: Female     Is Non-Hispanic African American: No     Diabetic: Yes     Tobacco smoker: No     Systolic Blood Pressure: 027 mmHg     Is BP  treated: Yes     HDL Cholesterol: 47 mg/dL     Total Cholesterol: 189 mg/dL       Relevant Medications   losartan-hydrochlorothiazide (HYZAAR) 100-12.5 MG tablet   metFORMIN (GLUCOPHAGE) 500 MG tablet   Other Relevant Orders   Lipid panel (Completed)   Comprehensive metabolic panel (Completed)   Pain of left heel    Burning pain to L heel as well as tender discomfort to insertion of achilles tendon. Anticipate enthesopathy vs retrocalcaneal bursitis. Recommend start using heel  lift gel inserts, and if not improved will refer to podiatry.         Meds ordered this encounter  Medications   losartan-hydrochlorothiazide (HYZAAR) 100-12.5 MG tablet    Sig: Take 1 tablet by mouth daily.    Dispense:  90 tablet    Refill:  2   metFORMIN (GLUCOPHAGE) 500 MG tablet    Sig: Take 1 tablet (500 mg total) by mouth daily with breakfast for 14 days, THEN 1 tablet (500 mg total) 2 (two) times daily with a meal.    Dispense:  180 tablet    Refill:  2   Orders Placed This Encounter  Procedures   VITAMIN D 25 Hydroxy (Vit-D Deficiency, Fractures)   Lipid panel   Comprehensive metabolic panel   Hemoglobin A1c   Microalbumin / creatinine urine ratio   Vitamin B12     Patient Instructions  Laboratorios hoy.  Trate plantilla (heel lift gel insert) para los zapatos para alivio de tendon de achilles.  Si no mejora, dejeme saber para remision a podiatra.  Comienze metformina '500mg'$  diarios for 2 semanas luego suba dosis a dos veces al dia.  Debroah Baller dosis de losartan a 100/12.'5mg'$  diarios.  Regresar en 3 meses para proximo examen de diabetes.  Price out Dexcom vs [employer] continuous glucose monitors.   Follow up plan: Return in about 3 months (around 07/17/2022), or if symptoms worsen or fail to improve, for follow up visit.  Ria Bush, MD

## 2022-04-17 ENCOUNTER — Other Ambulatory Visit: Payer: Self-pay | Admitting: Family Medicine

## 2022-04-17 ENCOUNTER — Encounter: Payer: Self-pay | Admitting: Family Medicine

## 2022-04-17 DIAGNOSIS — M79672 Pain in left foot: Secondary | ICD-10-CM | POA: Insufficient documentation

## 2022-04-17 MED ORDER — METFORMIN HCL 500 MG PO TABS: 500.0000 mg | ORAL_TABLET | Freq: Every day | ORAL | 3 refills | Status: DC

## 2022-04-17 NOTE — Assessment & Plan Note (Signed)
Burning pain to L heel as well as tender discomfort to insertion of achilles tendon. Anticipate enthesopathy vs retrocalcaneal bursitis. Recommend start using heel  lift gel inserts, and if not improved will refer to podiatry.

## 2022-04-17 NOTE — Assessment & Plan Note (Addendum)
Deteriorated glycemic control in setting of significant weight gain over the past year.  A1c up to 6.5% - back in diet controlled diabetes.  Will start metformin '500mg'$  once daily. She will renew efforts towards low sugar low carb diet.  RTC 3 mo DM f/u visit

## 2022-04-17 NOTE — Assessment & Plan Note (Signed)
Notes intolerance to breads - consider celiac testing.

## 2022-04-17 NOTE — Assessment & Plan Note (Signed)
Chronic, however BP elevation noted at home with weight gain.  Will increase hyzaar from 50/12.'5mg'$  to 100/12.'5mg'$  daily.  She will continue monitoring BP at home.

## 2022-04-17 NOTE — Assessment & Plan Note (Signed)
Update FLP as she's fasting.  The 10-year ASCVD risk score (Arnett DK, et al., 2019) is: 4%   Values used to calculate the score:     Age: 51 years     Sex: Female     Is Non-Hispanic African American: No     Diabetic: Yes     Tobacco smoker: No     Systolic Blood Pressure: 507 mmHg     Is BP treated: Yes     HDL Cholesterol: 47 mg/dL     Total Cholesterol: 189 mg/dL

## 2022-04-17 NOTE — Assessment & Plan Note (Signed)
Update levels - she's not been supplementing.

## 2022-04-17 NOTE — Assessment & Plan Note (Signed)
Discussed weight gain noted in setting of work promotion to more sedentary job, encouraged healthy diet and lifestyle choices to affect sustainable weight loss.

## 2022-04-18 MED ORDER — ATORVASTATIN CALCIUM 10 MG PO TABS: 10.0000 mg | ORAL_TABLET | Freq: Every day | ORAL | 2 refills | Status: DC

## 2022-04-18 MED ORDER — FREESTYLE LIBRE 2 SENSOR MISC
6 refills | Status: DC
Start: 2022-04-18 — End: 2023-06-25

## 2022-04-18 MED ORDER — FREESTYLE LIBRE 2 READER DEVI: 0 refills | Status: AC

## 2022-04-18 MED ORDER — LOSARTAN POTASSIUM-HCTZ 50-12.5 MG PO TABS: 1.0000 | ORAL_TABLET | Freq: Every day | ORAL | 2 refills | Status: DC

## 2022-12-02 ENCOUNTER — Emergency Department (HOSPITAL_COMMUNITY)
Admission: EM | Admit: 2022-12-02 | Discharge: 2022-12-02 | Disposition: A | Discharge status: Home / Self Care | Payer: Self-pay | Attending: Emergency Medicine | Admitting: Emergency Medicine

## 2022-12-02 DIAGNOSIS — R739 Hyperglycemia, unspecified: Secondary | ICD-10-CM | POA: Insufficient documentation

## 2022-12-02 DIAGNOSIS — Z79899 Other long term (current) drug therapy: Secondary | ICD-10-CM | POA: Insufficient documentation

## 2022-12-02 DIAGNOSIS — R112 Nausea with vomiting, unspecified: Secondary | ICD-10-CM | POA: Insufficient documentation

## 2022-12-02 DIAGNOSIS — I10 Essential (primary) hypertension: Secondary | ICD-10-CM | POA: Insufficient documentation

## 2022-12-02 DIAGNOSIS — H81399 Other peripheral vertigo, unspecified ear: Secondary | ICD-10-CM

## 2022-12-02 DIAGNOSIS — Z7984 Long term (current) use of oral hypoglycemic drugs: Secondary | ICD-10-CM | POA: Insufficient documentation

## 2022-12-02 DIAGNOSIS — R42 Dizziness and giddiness: Secondary | ICD-10-CM | POA: Insufficient documentation

## 2022-12-02 LAB — I-STAT CHEM 8, ED
BUN: 10 mg/dL (ref 6–20)
Calcium, Ion: 1.16 mmol/L (ref 1.15–1.40)
Chloride: 101 mmol/L (ref 98–111)
Creatinine, Ser: 0.4 mg/dL — ABNORMAL LOW (ref 0.44–1.00)
Glucose, Bld: 164 mg/dL — ABNORMAL HIGH (ref 70–99)
HCT: 44 % (ref 36.0–46.0)
Hemoglobin: 15 g/dL (ref 12.0–15.0)
Potassium: 3.1 mmol/L — ABNORMAL LOW (ref 3.5–5.1)
Sodium: 141 mmol/L (ref 135–145)
TCO2: 29 mmol/L (ref 22–32)

## 2022-12-02 MED ORDER — MECLIZINE HCL 25 MG PO TABS: 25.0000 mg | ORAL_TABLET | Freq: Three times a day (TID) | ORAL | 0 refills | Status: DC | PRN

## 2022-12-02 MED ORDER — ONDANSETRON HCL 4 MG PO TABS: 4.0000 mg | ORAL_TABLET | Freq: Four times a day (QID) | ORAL | 0 refills | Status: DC

## 2022-12-02 MED ORDER — MECLIZINE HCL 25 MG PO TABS
25.0000 mg | ORAL_TABLET | Freq: Once | ORAL | Status: AC
  Administered 2022-12-02: 25 mg via ORAL
  Filled 2022-12-02: qty 1

## 2022-12-02 MED ORDER — ONDANSETRON 4 MG PO TBDP
4.0000 mg | ORAL_TABLET | Freq: Once | ORAL | Status: AC
  Administered 2022-12-02: 4 mg via ORAL
  Filled 2022-12-02: qty 1

## 2022-12-02 MED ORDER — SODIUM CHLORIDE 0.9 % IV SOLN: INTRAVENOUS | Status: DC

## 2022-12-02 NOTE — ED Triage Notes (Signed)
Patient here with complaint of two episodes of dizziness on waking, one yesterday morning that resolved after approximately 90 minutes and again more intense this morning.

## 2022-12-02 NOTE — ED Provider Notes (Signed)
[facility] Provider Note   CSN: [id number] Arrival date & time: 12/02/22  1219     History  Chief Complaint  Patient presents with   Dizziness    Amy [name] is a 52 y.o. female.   Dizziness  This patient is a very friendly 52 year old female, history of high cholesterol as well as hypertension.  She was in her usual state of health until yesterday morning when she noted that moving her head from side-to-side caused increased amounts of dizziness which she describes as the room spinning followed by some nausea and vomiting.  This is made better when she holds perfectly still however it is not constant and she has no other neurologic symptoms including visual changes.  Today she had recurrent symptoms this morning that were much more severe in their intensity but brought on with the same movement of rolling onto her side.  She denies diarrhea abdominal pain and has no chest pain or shortness of breath.  She has never had this in the past and denies any ringing in the ears or head injury.    Home Medications Prior to Admission medications   Medication Sig Start Date End Date Taking? Authorizing Provider  meclizine (ANTIVERT) 25 MG tablet Take 1 tablet (25 mg total) by mouth 3 (three) times daily as needed for dizziness. 12/02/22  Yes Noemi Chapel, MD  ondansetron (ZOFRAN) 4 MG tablet Take 1 tablet (4 mg total) by mouth every 6 (six) hours. 12/02/22  Yes Noemi Chapel, MD  Ascorbic Acid (VITAMIN C) 100 MG tablet Take 100 mg by mouth daily. Patient not taking: Reported on 04/16/2022    [provider]  atorvastatin (LIPITOR) 10 MG tablet Take 1 tablet (10 mg total) by mouth daily. 04/18/22   Ria Bush, MD  Biotin 5 MG CAPS Take 1 capsule by mouth daily. Patient not taking: Reported on 04/16/2022    [provider]  Cholecalciferol (VITAMIN D3) 25 MCG (1000 UT) CAPS Take 1 capsule (1,000 Units total) by mouth  daily. Patient not taking: Reported on 04/16/2022 04/19/21   Ria Bush, MD  Continuous Blood Gluc Receiver (FREESTYLE LIBRE 2 READER) DEVI Use to check sugars daily E11.69 04/18/22   Ria Bush, MD  Continuous Blood Gluc Sensor (FREESTYLE LIBRE 2 SENSOR) MISC Use as directed to check sugars E11.69 04/18/22   Ria Bush, MD  cyanocobalamin (V-R VITAMIN B-12) 500 MCG tablet Take 1 tablet (500 mcg total) by mouth daily. Patient not taking: Reported on 04/16/2022 05/10/17   Ria Bush, MD  losartan-hydrochlorothiazide [facility]) 50-12.5 MG tablet Take 1 tablet by mouth daily. 04/18/22   Ria Bush, MD  metFORMIN (GLUCOPHAGE) 500 MG tablet Take 1 tablet (500 mg total) by mouth daily with breakfast. 04/17/22   Ria Bush, MD  Multiple Vitamins-Minerals (ZINC PO) Take by mouth daily. Patient not taking: Reported on 04/16/2022    [provider]      Allergies    Patient has no known allergies.    Review of Systems   Review of Systems  Neurological:  Positive for dizziness.  All other systems reviewed and are negative.   Physical Exam Updated Vital Signs BP (!) 165/96 (BP Location: Right Arm)   Pulse 84   Temp (!) 97.3 F (36.3 C) (Oral)   Resp 16   LMP 10/06/2014   SpO2 98%  Physical Exam Vitals and nursing note reviewed.  Constitutional:      General: She is not in  acute distress.    Appearance: She is well-developed.  HENT:     Head: Normocephalic and atraumatic.     Mouth/Throat:     Pharynx: No oropharyngeal exudate.  Eyes:     General: No scleral icterus.       Right eye: No discharge.        Left eye: No discharge.     Conjunctiva/sclera: Conjunctivae normal.     Pupils: Pupils are equal, round, and reactive to light.  Neck:     Thyroid: No thyromegaly.     Vascular: No JVD.  Cardiovascular:     Rate and Rhythm: Normal rate and regular rhythm.     Heart sounds: Normal heart sounds. No murmur heard.    No friction rub. No gallop.   Pulmonary:     Effort: Pulmonary effort is normal. No respiratory distress.     Breath sounds: Normal breath sounds. No wheezing or rales.  Abdominal:     General: Bowel sounds are normal. There is no distension.     Palpations: Abdomen is soft. There is no mass.     Tenderness: There is no abdominal tenderness.  Musculoskeletal:        General: No tenderness. Normal range of motion.     Cervical back: Normal range of motion and neck supple.     Right lower leg: No edema.     Left lower leg: No edema.  Lymphadenopathy:     Cervical: No cervical adenopathy.  Skin:    General: Skin is warm and dry.     Findings: No erythema or rash.  Neurological:     Mental Status: She is alert.     Coordination: Coordination normal.     Comments: Totally normal neurologic exam, cranial nerves III through XII are normal, bilateral upper extremities with totally normal strength and normal finger-nose-finger, no pronator drift.  The patient does have nystagmus which is inducible with moving the head either to the left or to the right.  This fatigues over about 1 minute when the patient is in a neutral position.  Psychiatric:        Behavior: Behavior normal.     ED Results / Procedures / Treatments   Labs (all labs ordered are listed, but only abnormal results are displayed) Labs Reviewed  I-STAT CHEM 8, ED - Abnormal; Notable for the following components:      Result Value   Potassium 3.1 (*)    Creatinine, Ser 0.40 (*)    Glucose, Bld 164 (*)    All other components within normal limits    EKG None  Radiology No results found.  Procedures Procedures    Medications Ordered in ED Medications  ondansetron (ZOFRAN-ODT) disintegrating tablet 4 mg (has no administration in time range)  0.9 %  sodium chloride infusion ( Intravenous New Bag/Given 12/02/22 1511)  meclizine (ANTIVERT) tablet 25 mg (25 mg Oral Given 12/02/22 1511)    ED Course/ Medical Decision Making/ A&P                              Medical Decision Making Risk Prescription drug management.   This patient presents to the ED for concern of peripheral vertigo differential diagnosis includes peripheral or central vertigo, less likely to be infectious.  Less likely to be medication related.  She does have some hypertension however it is unlikely to be the cause of her vertigo.  There is no  visual symptoms or other neurologic deficits and this is easily inducible by movement of the head suggesting a peripheral source.  I do not think that neuroimaging is necessary, she will get medications including meclizine and Zofran    Additional history obtained:  Additional history obtained from electronic medical record External records from outside source obtained and reviewed including follows closely with family doctor for hypertension, no recent ER visits or hospitalizations.   Lab Tests:  I Ordered, and personally interpreted labs.  The pertinent results include: Metabolic panel and hemoglobin all unremarkable, mild hyperglycemia     Medicines ordered and prescription drug management:  I ordered medication including Zofran and meclizine for peripheral vertigo, IV fluids were also given Reevaluation of the patient after these medicines showed that the patient improved and essentially resolved and the patient was holding perfectly still I have reviewed the patients home medicines and have made adjustments as needed   Problem List / ED Course:  Peripheral vertigo, classic textbook presentation.  No other neurologic symptoms, totally inducible with head movement and fatigable over 1 to 2 minutes.  Patient given instructions for return and follow-up with ENT, she is agreeable   Social Determinants of Health:  None           Final Clinical Impression(s) / ED Diagnoses Final diagnoses:  Peripheral vertigo, unspecified laterality    Rx / DC Orders ED Discharge Orders          Ordered     ondansetron (ZOFRAN) 4 MG tablet  Every 6 hours        12/02/22 1518    meclizine (ANTIVERT) 25 MG tablet  3 times daily PRN        12/02/22 1518              Noemi Chapel, MD 12/02/22 1519

## 2022-12-02 NOTE — Discharge Instructions (Signed)
Please read the attached instructions.  As we discussed I want you to stay hydrated drinking plenty of clear liquids.  He may take Zofran every 6 hours as needed for nausea, meclizine every 6 hours as needed for dizziness but be aware that your symptoms may last for another couple of days or even a week.  This should gradually get better.  You should not drive until you are completely resolved.  I do want you to follow-up with an ear nose and throat doctor if this continues, see their phone number above.  Please let your family doctor know that you were in the emergency department and have a follow-up this week.  Emergency department for severe worsening dizziness or other neurologic symptoms such as changes in speech, vision, strength or sensation or ability to walk, or if your vertigo is not getting any better with holding perfectly still

## 2022-12-03 ENCOUNTER — Telehealth: Payer: Self-pay

## 2022-12-03 NOTE — Transitions of Care (Post Inpatient/ED Visit) (Unsigned)
   12/03/2022  Name: Amy [name] MRN: [id number] DOB: [birth year]  Today's TOC FU Call Status: Today's TOC FU Call Status:: Unsuccessul Call (1st Attempt)  Attempted to reach the patient regarding the most recent Inpatient/ED visit.  Follow Up Plan: Additional outreach attempts will be made to reach the patient to complete the Transitions of Care (Post Inpatient/ED visit) call.   Signature Francella Solian, CMA

## 2022-12-06 NOTE — Transitions of Care (Post Inpatient/ED Visit) (Signed)
   12/06/2022  Name: Amy [name] MRN: [id number] DOB: [birth year]  Today's TOC FU Call Status: Today's TOC FU Call Status:: Unsuccessful Call (2nd Attempt) Unsuccessful Call (2nd Attempt) Date: 12/06/22  Attempted to reach the patient regarding the most recent Inpatient/ED visit.  Follow Up Plan: Additional outreach attempts will be made to reach the patient to complete the Transitions of Care (Post Inpatient/ED visit) call.   Signature Francella Solian, CMA

## 2023-04-30 ENCOUNTER — Encounter: Payer: Self-pay | Admitting: Family Medicine

## 2023-04-30 ENCOUNTER — Ambulatory Visit (INDEPENDENT_AMBULATORY_CARE_PROVIDER_SITE_OTHER): Payer: Self-pay | Admitting: Family Medicine

## 2023-04-30 VITALS — BP 136/84 | HR 103 | Temp 97.6°F | Ht 64.75 in | Wt 188.5 lb

## 2023-04-30 DIAGNOSIS — I10 Essential (primary) hypertension: Secondary | ICD-10-CM

## 2023-04-30 DIAGNOSIS — M545 Low back pain, unspecified: Secondary | ICD-10-CM | POA: Insufficient documentation

## 2023-04-30 DIAGNOSIS — E785 Hyperlipidemia, unspecified: Secondary | ICD-10-CM

## 2023-04-30 DIAGNOSIS — J069 Acute upper respiratory infection, unspecified: Secondary | ICD-10-CM | POA: Insufficient documentation

## 2023-04-30 DIAGNOSIS — E1169 Type 2 diabetes mellitus with other specified complication: Secondary | ICD-10-CM

## 2023-04-30 LAB — POC URINALSYSI DIPSTICK (AUTOMATED)
Bilirubin, UA: NEGATIVE
Blood, UA: NEGATIVE
Glucose, UA: NEGATIVE
Ketones, UA: NEGATIVE
Nitrite, UA: NEGATIVE
Protein, UA: NEGATIVE
Spec Grav, UA: <=1.005 — AB (ref 1.010–1.025)
Urobilinogen, UA: 0.2 E.U./dL
pH, UA: 6 (ref 5.0–8.0)

## 2023-04-30 LAB — POCT GLYCOSYLATED HEMOGLOBIN (HGB A1C): Hemoglobin A1C: 6.8 % — AB (ref 4.0–5.6)

## 2023-04-30 MED ORDER — KETOROLAC TROMETHAMINE 30 MG/ML IJ SOLN
30.0000 mg | Freq: Once | INTRAMUSCULAR | Status: AC
  Administered 2023-04-30: 30 mg via INTRAMUSCULAR

## 2023-04-30 MED ORDER — BENZONATATE 100 MG PO CAPS: 100.0000 mg | ORAL_CAPSULE | Freq: Three times a day (TID) | ORAL | 0 refills | Status: DC | PRN

## 2023-04-30 NOTE — Assessment & Plan Note (Addendum)
Stable period on Hyzaar 50/12.5mg  daily.  She will return for CPE

## 2023-04-30 NOTE — Assessment & Plan Note (Addendum)
Lungs clear today. Ok for outpatient management.  Supportive measures reviewed - fluids, NSAID, rest. Update if not improving with treatment.  If ongoing symptoms, recommend further eval with labwork.  Out of work x 2 days, with RTW Friday. Tessalon perls for cough.

## 2023-04-30 NOTE — Assessment & Plan Note (Signed)
Not currently taking atorvastatin.

## 2023-04-30 NOTE — Assessment & Plan Note (Addendum)
Update A1c off metformin.  She notes hypoglycemia when active walking in park.  She's not recently been using continuous glucose monitor.

## 2023-04-30 NOTE — Assessment & Plan Note (Addendum)
UA today with 1+ LE, however micro not indicative of infection. UCx sent.  Encouraged continue water, NSAID prn.  Toradol shot 30mg  today.

## 2023-04-30 NOTE — Progress Notes (Signed)
Ph: [phone] Fax: [phone]   Patient ID: Amy [name], female    DOB: [birth year], 52 y.o.   MRN: [id number]  This visit was conducted in person.  BP 136/84   Pulse (!) 103   Temp 97.6 F (36.4 C) (Temporal)   Ht 5' 4.75" (1.645 m)   Wt 188 lb 8 oz (85.5 kg)   LMP 10/06/2014   SpO2 96%   BMI 31.61 kg/m    CC: cough, fever/chills malaise and body aches Subjective:   HPI: Amy [name] is a 52 y.o. female presenting on 04/30/2023 for Cough (C/o cough, fever, chills, B low back pain radiating around to low abd pain. Sxs started 04/26/23. Neg home Covid test [x2]- 04/27/23, 04/29/23. Pt accompanied by daughter, Amy [name]. )   5d h/o subjective fever/chills, R>L lower back pain and flank with radiation to lower abdomen. Started with sore throat now better, has had night sweats as well. Cough started 2d ago, productive. Some ear and tooth pain. Head > chest congestion.   No headache, chest pain, dyspnea.  No recent tick bites.  No rash.   No dysuria, urgency, frequency, hematuria, nausea/vomiting.   Treating with motrin 400mg  TID PRN pain at home.  Had negative COVID swabs x2 at home.   Boss sick recently as well.   Diabetic - continues hyzaar, no longer taking metformin or atorvastatin.  Lab Results  Component Value Date   HGBA1C 6.8 (A) 04/30/2023  Has not been checking sugars recently.      Relevant past medical, surgical, family and social history reviewed and updated as indicated. Interim medical history since our last visit reviewed. Allergies and medications reviewed and updated. Outpatient Medications Prior to Visit  Medication Sig Dispense Refill   Continuous Blood Gluc Receiver (FREESTYLE LIBRE 2 READER) DEVI Use to check sugars daily E11.69 1 each 0   Continuous Blood Gluc Sensor (FREESTYLE LIBRE 2 SENSOR) MISC Use as directed to check sugars E11.69 2 each 6   losartan-hydrochlorothiazide (HYZAAR) 50-12.5 MG tablet Take 1 tablet by mouth daily. 90  tablet 2   Ascorbic Acid (VITAMIN C) 100 MG tablet Take 100 mg by mouth daily. (Patient not taking: Reported on 04/16/2022)     atorvastatin (LIPITOR) 10 MG tablet Take 1 tablet (10 mg total) by mouth daily. (Patient not taking: Reported on 04/30/2023) 90 tablet 2   Biotin 5 MG CAPS Take 1 capsule by mouth daily. (Patient not taking: Reported on 04/16/2022)     Cholecalciferol (VITAMIN D3) 25 MCG (1000 UT) CAPS Take 1 capsule (1,000 Units total) by mouth daily. (Patient not taking: Reported on 04/16/2022) 30 capsule    cyanocobalamin (V-R VITAMIN B-12) 500 MCG tablet Take 1 tablet (500 mcg total) by mouth daily. (Patient not taking: Reported on 04/16/2022)     meclizine (ANTIVERT) 25 MG tablet Take 1 tablet (25 mg total) by mouth 3 (three) times daily as needed for dizziness. (Patient not taking: Reported on 04/30/2023) 30 tablet 0   metFORMIN (GLUCOPHAGE) 500 MG tablet Take 1 tablet (500 mg total) by mouth daily with breakfast. (Patient not taking: Reported on 04/30/2023) 90 tablet 3   Multiple Vitamins-Minerals (ZINC PO) Take by mouth daily. (Patient not taking: Reported on 04/16/2022)     ondansetron (ZOFRAN) 4 MG tablet Take 1 tablet (4 mg total) by mouth every 6 (six) hours. (Patient not taking: Reported on 04/30/2023) 12 tablet 0   No facility-administered medications prior to visit.     Per HPI unless  specifically indicated in ROS section below Review of Systems  Objective:  BP 136/84   Pulse (!) 103   Temp 97.6 F (36.4 C) (Temporal)   Ht 5' 4.75" (1.645 m)   Wt 188 lb 8 oz (85.5 kg)   LMP 10/06/2014   SpO2 96%   BMI 31.61 kg/m   Wt Readings from Last 3 Encounters:  04/30/23 188 lb 8 oz (85.5 kg)  04/16/22 193 lb 2 oz (87.6 kg)  11/23/21 184 lb (83.5 kg)      Physical Exam Vitals and nursing note reviewed.  Constitutional:      Appearance: Normal appearance. She is not ill-appearing.  HENT:     Head: Normocephalic and atraumatic.     Right Ear: Tympanic membrane, ear canal and  external ear normal. There is no impacted cerumen.     Left Ear: Tympanic membrane, ear canal and external ear normal. There is no impacted cerumen.     Nose: Congestion present. No rhinorrhea.     Mouth/Throat:     Mouth: Mucous membranes are moist.     Pharynx: Oropharynx is clear. No oropharyngeal exudate or posterior oropharyngeal erythema.  Eyes:     Extraocular Movements: Extraocular movements intact.     Conjunctiva/sclera: Conjunctivae normal.     Pupils: Pupils are equal, round, and reactive to light.  Cardiovascular:     Rate and Rhythm: Normal rate and regular rhythm.     Pulses: Normal pulses.     Heart sounds: Normal heart sounds. No murmur heard. Pulmonary:     Effort: Pulmonary effort is normal. No respiratory distress.     Breath sounds: Normal breath sounds. No wheezing, rhonchi or rales.  Abdominal:     General: Bowel sounds are normal. There is no distension.     Palpations: Abdomen is soft. There is no mass.     Tenderness: There is abdominal tenderness (mild) in the right lower quadrant, epigastric area and left lower quadrant. There is no right CVA tenderness, left CVA tenderness, guarding or rebound. Negative signs include Murphy's sign.     Hernia: No hernia is present.  Musculoskeletal:     Cervical back: Normal range of motion and neck supple.     Comments:  Discomfort to palpation lower lumbar paraspinous mm No significant midline lumbar spine pain   Lymphadenopathy:     Head:     Right side of head: No submental, submandibular, tonsillar, preauricular or posterior auricular adenopathy.     Left side of head: No submental, submandibular, tonsillar, preauricular or posterior auricular adenopathy.     Cervical: No cervical adenopathy.     Right cervical: No superficial cervical adenopathy.    Left cervical: No superficial cervical adenopathy.     Upper Body:     Right upper body: No supraclavicular adenopathy.     Left upper body: No supraclavicular  adenopathy.  Skin:    Findings: No rash.  Neurological:     Mental Status: She is alert.  Psychiatric:        Mood and Affect: Mood normal.        Behavior: Behavior normal.       Results for orders placed or performed in visit on 04/30/23  POCT Urinalysis Dipstick (Automated)  Result Value Ref Range   Color, UA yellow    Clarity, UA clear    Glucose, UA Negative Negative   Bilirubin, UA negative    Ketones, UA negative    Spec Grav, UA <=1.005 (A)  1.010 - 1.025   Blood, UA negative    pH, UA 6.0 5.0 - 8.0   Protein, UA Negative Negative   Urobilinogen, UA 0.2 0.2 or 1.0 E.U./dL   Nitrite, UA negative    Leukocytes, UA Small (1+) (A) Negative  POCT glycosylated hemoglobin (Hb A1C)  Result Value Ref Range   Hemoglobin A1C 6.8 (A) 4.0 - 5.6 %   HbA1c POC (<> result, manual entry)     HbA1c, POC (prediabetic range)     HbA1c, POC (controlled diabetic range)     Lab Results  Component Value Date   NA 141 12/02/2022   CL 101 12/02/2022   K 3.1 (L) 12/02/2022   CO2 25 04/16/2022   BUN 10 12/02/2022   CREATININE 0.40 (L) 12/02/2022   GFR 108.05 04/16/2022   CALCIUM 8.8 04/16/2022   ALBUMIN 4.3 04/16/2022   GLUCOSE 164 (H) 12/02/2022    Assessment & Plan:   Problem List Items Addressed This Visit     Type 2 diabetes mellitus with other specified complication (HCC)    Update A1c off metformin.  She notes hypoglycemia when active walking in park.  She's not recently been using continuous glucose monitor.       Relevant Orders   POCT glycosylated hemoglobin (Hb A1C) (Completed)   Essential hypertension    Stable period on Hyzaar 50/12.5mg  daily.  She will return for CPE      Dyslipidemia associated with type 2 diabetes mellitus (HCC)    Not currently taking atorvastatin.       Viral URI with cough - Primary    Lungs clear today. Ok for outpatient management.  Supportive measures reviewed - fluids, NSAID, rest. Update if not improving with treatment.  If  ongoing symptoms, recommend further eval with labwork.  Out of work x 2 days, with RTW Friday. Tessalon perls for cough.       Acute bilateral low back pain without sciatica    UA today with 1+ LE, however micro not indicative of infection. UCx sent.  Encouraged continue water, NSAID prn.  Toradol shot 30mg  today.       Relevant Orders   POCT Urinalysis Dipstick (Automated) (Completed)   Urine Culture     Meds ordered this encounter  Medications   benzonatate (TESSALON PERLES) 100 MG capsule    Sig: Take 1 capsule (100 mg total) by mouth 3 (three) times daily as needed for cough.    Dispense:  30 capsule    Refill:  0   ketorolac (TORADOL) 30 MG/ML injection 30 mg    Orders Placed This Encounter  Procedures   Urine Culture   POCT Urinalysis Dipstick (Automated)   POCT glycosylated hemoglobin (Hb A1C)    Patient Instructions  Creo tiene infeccion viral respiratoria.  [facility] agua, puede seguir usando ibuprofeno o tylenol para dolor y [place].  Si no mejora en la proxima semana, regresar para laboratorios.  [place]ina por lo general se ve bien. He mandado cultura para asegurar que no haya infeccion.  A1c hoy.  Regersar para fisico.   Follow up plan: Return if symptoms worsen or fail to improve, for annual exam, prior fasting for blood work.  Eustaquio Boyden, MD

## 2023-04-30 NOTE — Patient Instructions (Addendum)
Creo tiene infeccion viral respiratoria.  [facility] agua, puede seguir usando ibuprofeno o tylenol para dolor y [place].  Si no mejora en la proxima semana, regresar para laboratorios.  [place] por lo general se ve bien. He mandado cultura para asegurar que no haya infeccion.  A1c hoy.  Regersar para fisico.

## 2023-05-01 ENCOUNTER — Encounter: Payer: Self-pay | Admitting: Family Medicine

## 2023-05-01 DIAGNOSIS — R1030 Lower abdominal pain, unspecified: Secondary | ICD-10-CM

## 2023-05-01 DIAGNOSIS — M545 Low back pain, unspecified: Secondary | ICD-10-CM

## 2023-05-01 LAB — URINE CULTURE
MICRO NUMBER:: 15206383
Result:: NO GROWTH
SPECIMEN QUALITY:: ADEQUATE

## 2023-05-01 NOTE — Telephone Encounter (Signed)
Pt called following up on a mychart message she sent Dr. Reece Agar. Pt states last night around 10pm, the back pain began again, only in her lower back this time. Pt states this morning, she is having issues trying to walk. Pt states when she saw Dr. Reece Agar yesterday, 7/16, she received a shot for inflammation & was told to let Dr. Reece Agar know if the pain continues or not so a possible nerve block test can be performed. Please advise. Call back # [phone]

## 2023-05-02 NOTE — Telephone Encounter (Signed)
Offered 11am appt for tomorrow if ongoing pain for further evaluation.

## 2023-05-03 ENCOUNTER — Encounter: Payer: Self-pay | Admitting: Family Medicine

## 2023-05-03 ENCOUNTER — Ambulatory Visit (INDEPENDENT_AMBULATORY_CARE_PROVIDER_SITE_OTHER): Payer: Self-pay | Admitting: Family Medicine

## 2023-05-03 VITALS — BP 122/84 | HR 80 | Temp 97.6°F | Ht 64.75 in | Wt 191.0 lb

## 2023-05-03 DIAGNOSIS — R1084 Generalized abdominal pain: Secondary | ICD-10-CM

## 2023-05-03 DIAGNOSIS — M545 Low back pain, unspecified: Secondary | ICD-10-CM

## 2023-05-03 DIAGNOSIS — R1013 Epigastric pain: Secondary | ICD-10-CM

## 2023-05-03 DIAGNOSIS — J069 Acute upper respiratory infection, unspecified: Secondary | ICD-10-CM

## 2023-05-03 DIAGNOSIS — K582 Mixed irritable bowel syndrome: Secondary | ICD-10-CM

## 2023-05-03 LAB — CBC WITH DIFFERENTIAL/PLATELET
Absolute Monocytes: 476 cells/uL (ref 200–950)
Basophils Absolute: 48 cells/uL (ref 0–200)
Basophils Relative: 0.7 %
Eosinophils Absolute: 159 cells/uL (ref 15–500)
Eosinophils Relative: 2.3 %
HCT: 42.3 % (ref 35.0–45.0)
Hemoglobin: 14.2 g/dL (ref 11.7–15.5)
Lymphs Abs: 2608 cells/uL (ref 850–3900)
MCH: 30.6 pg (ref 27.0–33.0)
MCHC: 33.6 g/dL (ref 32.0–36.0)
MCV: 91.2 fL (ref 80.0–100.0)
MPV: 10.7 fL (ref 7.5–12.5)
Monocytes Relative: 6.9 %
Neutro Abs: 3609 cells/uL (ref 1500–7800)
Neutrophils Relative %: 52.3 %
Platelets: 290 10*3/uL (ref 140–400)
RBC: 4.64 10*6/uL (ref 3.80–5.10)
RDW: 11.9 % (ref 11.0–15.0)
Total Lymphocyte: 37.8 %
WBC: 6.9 10*3/uL (ref 3.8–10.8)

## 2023-05-03 LAB — COMPREHENSIVE METABOLIC PANEL
AG Ratio: 1.4 (calc) (ref 1.0–2.5)
ALT: 14 U/L (ref 6–29)
AST: 14 U/L (ref 10–35)
Albumin: 4.2 g/dL (ref 3.6–5.1)
Alkaline phosphatase (APISO): 79 U/L (ref 37–153)
BUN: 15 mg/dL (ref 7–25)
CO2: 29 mmol/L (ref 20–32)
Calcium: 9.4 mg/dL (ref 8.6–10.4)
Chloride: 102 mmol/L (ref 98–110)
Creat: 0.68 mg/dL (ref 0.50–1.03)
Globulin: 2.9 g/dL (calc) (ref 1.9–3.7)
Glucose, Bld: 109 mg/dL — ABNORMAL HIGH (ref 65–99)
Potassium: 3.9 mmol/L (ref 3.5–5.3)
Sodium: 139 mmol/L (ref 135–146)
Total Bilirubin: 0.6 mg/dL (ref 0.2–1.2)
Total Protein: 7.1 g/dL (ref 6.1–8.1)

## 2023-05-03 LAB — LIPASE: Lipase: 24 U/L (ref 7–60)

## 2023-05-03 MED ORDER — FAMOTIDINE 20 MG PO TABS: 20.0000 mg | ORAL_TABLET | Freq: Two times a day (BID) | ORAL | Status: AC

## 2023-05-03 MED ORDER — CYCLOBENZAPRINE HCL 10 MG PO TABS: 5.0000 mg | ORAL_TABLET | Freq: Two times a day (BID) | ORAL | 0 refills | Status: DC | PRN

## 2023-05-03 MED ORDER — TRAMADOL HCL 50 MG PO TABS: 50.0000 mg | ORAL_TABLET | Freq: Three times a day (TID) | ORAL | 0 refills | Status: AC | PRN

## 2023-05-03 NOTE — Assessment & Plan Note (Signed)
H/o this.  ?

## 2023-05-03 NOTE — Assessment & Plan Note (Signed)
Low back pain in setting of heavier physical labor than she was used to a few days preceding symptoms - most consistent with lumbar strain. No signs of nerve or spine compression.  Supportive measures reviewed - NSAID, tramadol, flexeril.  Add pepcid 20mg  BID OTC for possible reflux from NSAID use. In setting of radiation of pain to abdomen, will check labwork including CBC, CMP, lipase. Discussed possible abdominal imaging pending lab results and response to treatment.

## 2023-05-03 NOTE — Patient Instructions (Addendum)
Laboratorios hoy.  Comienze pepcid 20mg  dos veces al dia para dolor de estomago/acidez Comienze flexeril 10mg  1/2-1 [employer] al dia para relajante muscular.  Puede usar tramadol 50mg  1/2-1 [employer] al dia para dolor de espalda.  Si regresa fiebre alta >101, nausea/vomito, empeora dolor abdominal, ir a urgencias para evaluacion.

## 2023-05-03 NOTE — Progress Notes (Signed)
Ph: [phone] Fax: [phone]   Patient ID: Amy [name], female    DOB: October 29, 1970, 52 y.o.   MRN: [id number]  This visit was conducted in person.  BP 122/84   Pulse 80   Temp 97.6 F (36.4 C) (Temporal)   Ht 5' 4.75" (1.645 m)   Wt 191 lb (86.6 kg)   LMP 10/06/2014   SpO2 98%   BMI 32.03 kg/m    CC: low back pain Subjective:   HPI: Amy [name] is a 52 y.o. female presenting on 05/03/2023 for Back Pain (C/o worsening low back pain radiating around to abd. Pt accompanied by daughter, [name]. )   See prior note for details.  Seen here earlier this week with subjective fevers/chills, ST, night sweats, cough and congestion, as well as low back pain, low abd pain. Thought to have viral URI treated with IM toradol, home oral NSAID, tessalon perls and supportive care at home.  UA/micro, UCx was reassuring. Did not have significant UTI or bowel symptoms.  Decreased appetite.  No vaginal bleeding, discharge.  No radiculopathy, bowel/bladder incontinence, saddle anesthesia.  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  Notes increased bloating, gas.  Notes some constipation. H/o IBS. H/o cholecystectomy.   Louann Sjogren and his family has been sick at work.   LMP 65 - early menopause age 83yo.      Relevant past medical, surgical, family and social history reviewed and updated as indicated. Interim medical history since our last visit reviewed. Allergies and medications reviewed and updated. Outpatient Medications Prior to Visit  Medication Sig Dispense Refill   benzonatate (TESSALON PERLES) 100 MG capsule Take 1 capsule (100 mg total) by mouth 3 (three) times daily as needed for cough. 30 capsule 0   Continuous Blood Gluc Receiver (FREESTYLE LIBRE 2 READER) DEVI Use to check sugars daily E11.69 1 each 0   Continuous Blood Gluc Sensor  (FREESTYLE LIBRE 2 SENSOR) MISC Use as directed to check sugars E11.69 2 each 6   losartan-hydrochlorothiazide (HYZAAR) 50-12.5 MG tablet Take 1 tablet by mouth daily. 90 tablet 2   No facility-administered medications prior to visit.     Per HPI unless specifically indicated in ROS section below Review of Systems  Objective:  BP 122/84   Pulse 80   Temp 97.6 F (36.4 C) (Temporal)   Ht 5' 4.75" (1.645 m)   Wt 191 lb (86.6 kg)   LMP 10/06/2014   SpO2 98%   BMI 32.03 kg/m   Wt Readings from Last 3 Encounters:  05/03/23 191 lb (86.6 kg)  04/30/23 188 lb 8 oz (85.5 kg)  04/16/22 193 lb 2 oz (87.6 kg)      Physical Exam Vitals and nursing note reviewed.  Constitutional:      Appearance: Normal appearance. She is not ill-appearing.  HENT:     Mouth/Throat:     Mouth: Mucous membranes are moist.     Pharynx: Oropharynx is clear. No oropharyngeal exudate or posterior oropharyngeal erythema.  Eyes:     Extraocular Movements: Extraocular movements intact.     Conjunctiva/sclera: Conjunctivae normal.     Pupils: Pupils are equal, round, and reactive to light.  Neck:     Thyroid: No thyroid mass or thyromegaly.  Cardiovascular:     Rate and Rhythm: Normal rate and regular rhythm.     Pulses: Normal pulses.  Heart sounds: Normal heart sounds. No murmur heard. Pulmonary:     Effort: Pulmonary effort is normal. No respiratory distress.     Breath sounds: Normal breath sounds. No wheezing, rhonchi or rales.  Abdominal:     General: Bowel sounds are normal. There is no distension.     Palpations: There is no mass.     Tenderness: There is generalized abdominal tenderness (moderate epigastric, mild otherwise). There is no right CVA tenderness, left CVA tenderness, guarding or rebound. Negative signs include Murphy's sign.     Hernia: No hernia is present.     Comments:    Musculoskeletal:     Cervical back: Normal range of motion and neck supple.     Right lower leg: No  edema.     Left lower leg: No edema.     Comments:  + pain midline lumbar spine + paraspinous mm tenderness bilateral lower lumbar region Neg SLR bilaterally. No pain with int/ext rotation at hip. Discomfort with FABER bilaterally.  Skin:    General: Skin is warm and dry.     Findings: No rash.  Neurological:     Mental Status: She is alert.     Sensory: Sensation is intact.     Motor: Motor function is intact.     Coordination: Coordination is intact.     Comments:  Sensation intact BLE 5/5 strength BLE  Psychiatric:        Mood and Affect: Mood normal.        Behavior: Behavior normal.       Results for orders placed or performed in visit on 05/03/23  CBC with Differential/Platelet  Result Value Ref Range   WBC 6.9 3.8 - 10.8 Thousand/uL   RBC 4.64 3.80 - 5.10 Million/uL   Hemoglobin 14.2 11.7 - 15.5 g/dL   HCT 69.6 29.5 - 28.4 %   MCV 91.2 80.0 - 100.0 fL   MCH 30.6 27.0 - 33.0 pg   MCHC 33.6 32.0 - 36.0 g/dL   RDW 13.2 44.0 - 10.2 %   Platelets 290 140 - 400 Thousand/uL   MPV 10.7 7.5 - 12.5 fL   Neutro Abs 3,609 1,500 - 7,800 cells/uL   Lymphs Abs 2,608 850 - 3,900 cells/uL   Absolute Monocytes 476 200 - 950 cells/uL   Eosinophils Absolute 159 15 - 500 cells/uL   Basophils Absolute 48 0 - 200 cells/uL   Neutrophils Relative % 52.3 %   Total Lymphocyte 37.8 %   Monocytes Relative 6.9 %   Eosinophils Relative 2.3 %   Basophils Relative 0.7 %    Assessment & Plan:   Problem List Items Addressed This Visit     Irritable bowel syndrome (IBS)    H/o this.       Relevant Medications   famotidine (PEPCID) 20 MG tablet   Viral URI with cough    These symptoms are improving as expected.       Acute bilateral low back pain without sciatica - Primary    Low back pain in setting of heavier physical labor than she was used to a few days preceding symptoms - most consistent with lumbar strain. No signs of nerve or spine compression.  Supportive measures  reviewed - NSAID, tramadol, flexeril.  Add pepcid 20mg  BID OTC for possible reflux from NSAID use. In setting of radiation of pain to abdomen, will check labwork including CBC, CMP, lipase. Discussed possible abdominal imaging pending lab results and response to treatment.  Relevant Medications   cyclobenzaprine (FLEXERIL) 10 MG tablet   traMADol (ULTRAM) 50 MG tablet   Other Visit Diagnoses     Generalized abdominal pain       Relevant Orders   CBC with Differential/Platelet (Completed)   Comprehensive metabolic panel   Lipase        Meds ordered this encounter  Medications   cyclobenzaprine (FLEXERIL) 10 MG tablet    Sig: Take 0.5-1 tablets (5-10 mg total) by mouth 2 (two) times daily as needed for muscle spasms (sedation precautions).    Dispense:  30 tablet    Refill:  0   traMADol (ULTRAM) 50 MG tablet    Sig: Take 1 tablet (50 mg total) by mouth every 8 (eight) hours as needed for up to 5 days for moderate pain (sedation precautions).    Dispense:  15 tablet    Refill:  0   famotidine (PEPCID) 20 MG tablet    Sig: Take 1 tablet (20 mg total) by mouth 2 (two) times daily.    Orders Placed This Encounter  Procedures   CBC with Differential/Platelet   Comprehensive metabolic panel   Lipase    Patient Instructions  Laboratorios hoy.  Comienze pepcid 20mg  dos veces al dia para dolor de estomago/acidez Comienze flexeril 10mg  1/2-1 [employer] al dia para relajante muscular.  Puede usar tramadol 50mg  1/2-1 [employer] al dia para dolor de espalda.  Si regresa fiebre alta >101, nausea/vomito, empeora dolor abdominal, ir a urgencias para evaluacion.   Follow up plan: Return if symptoms worsen or fail to improve.  Eustaquio Boyden, MD

## 2023-05-03 NOTE — Assessment & Plan Note (Signed)
These symptoms are improving as expected.

## 2023-05-07 ENCOUNTER — Encounter: Payer: Self-pay | Admitting: *Deleted

## 2023-05-07 MED ORDER — PREDNISONE 20 MG PO TABS: ORAL_TABLET | ORAL | 0 refills | Status: DC

## 2023-05-07 NOTE — Addendum Note (Signed)
Addended by: Eustaquio Boyden on: 05/07/2023 08:19 AM   Modules accepted: Orders

## 2023-05-08 ENCOUNTER — Ambulatory Visit (INDEPENDENT_AMBULATORY_CARE_PROVIDER_SITE_OTHER)
Admission: RE | Admit: 2023-05-08 | Discharge: 2023-05-08 | Disposition: A | Discharge status: Home / Self Care | Payer: Self-pay | Source: Ambulatory Visit | Attending: Family Medicine | Admitting: Family Medicine

## 2023-05-08 DIAGNOSIS — M545 Low back pain, unspecified: Secondary | ICD-10-CM

## 2023-05-08 NOTE — Addendum Note (Signed)
Addended by: Eustaquio Boyden on: 05/08/2023 09:17 AM   Modules accepted: Orders

## 2023-06-23 ENCOUNTER — Other Ambulatory Visit: Payer: Self-pay | Admitting: Family Medicine

## 2023-07-09 LAB — HM DIABETES EYE EXAM

## 2023-08-01 ENCOUNTER — Encounter: Payer: Self-pay | Admitting: Family Medicine

## 2023-08-01 ENCOUNTER — Other Ambulatory Visit: Payer: Self-pay | Admitting: Family Medicine

## 2023-08-02 ENCOUNTER — Telehealth: Payer: Self-pay | Admitting: Family Medicine

## 2023-08-02 DIAGNOSIS — I10 Essential (primary) hypertension: Secondary | ICD-10-CM

## 2023-08-02 DIAGNOSIS — E1169 Type 2 diabetes mellitus with other specified complication: Secondary | ICD-10-CM

## 2023-08-02 MED ORDER — LOSARTAN POTASSIUM-HCTZ 50-12.5 MG PO TABS: 1.0000 | ORAL_TABLET | Freq: Every day | ORAL | 0 refills | Status: DC

## 2023-08-02 MED ORDER — FREESTYLE LIBRE 2 SENSOR MISC: 0 refills | Status: DC

## 2023-08-02 NOTE — Telephone Encounter (Signed)
Prescription Request  08/02/2023  LOV: 05/03/2023  What is the name of the medication or equipment? losartan-hydrochlorothiazide (HYZAAR) 50-12.5 MG tablet Continuous Glucose Sensor (FREESTYLE LIBRE 2 SENSOR) MISC   Have you contacted your pharmacy to request a refill? Yes   Which pharmacy would you like this sent to?  [facility] [address], [place] - [address] [place] [address] Phone: [phone] Fax: [phone]    Patient notified that their request is being sent to the clinical staff for review and that they should receive a response within 2 business days.   Please advise at Mobile [phone] (mobile)

## 2023-08-02 NOTE — Telephone Encounter (Signed)
Next OV:  08/23/23, CPE  E-scribed refills

## 2023-08-02 NOTE — Telephone Encounter (Signed)
E-scribed refill (see 08/02/23 phn note).

## 2023-08-09 LAB — CBC AND DIFFERENTIAL
HCT: 42 (ref 36–46)
Hemoglobin: 13.9 (ref 12.0–16.0)
Hemoglobin: 13.9 (ref 12.0–16.0)
Platelets: 287 10*3/uL (ref 150–400)
WBC: 6.6

## 2023-08-09 LAB — HEPATIC FUNCTION PANEL
ALT: 18 U/L (ref 7–35)
ALT: 18 U/L (ref 7–35)
AST: 18 (ref 13–35)
AST: 18 (ref 13–35)

## 2023-08-09 LAB — BASIC METABOLIC PANEL
Creatinine: 0.7 (ref 0.5–1.1)
Creatinine: 0.7 (ref 0.5–1.1)
Glucose: 139
Glucose: 139
Potassium: 4 meq/L (ref 3.5–5.1)
Potassium: 4 meq/L (ref 3.5–5.1)
Sodium: 142 (ref 137–147)
Sodium: 142 (ref 137–147)

## 2023-08-09 LAB — HM MAMMOGRAPHY

## 2023-08-09 LAB — LIPID PANEL
Cholesterol: 195 (ref 0–200)
Cholesterol: 195 (ref 0–200)
HDL: 45 (ref 35–70)
HDL: 45 (ref 35–70)
LDL Cholesterol: 123
LDL Cholesterol: 132
Triglycerides: 129 (ref 40–160)
Triglycerides: 129 (ref 40–160)

## 2023-08-09 LAB — HEMOGLOBIN A1C
Hemoglobin A1C: 6.7
Hemoglobin A1C: 6.7

## 2023-08-09 NOTE — Telephone Encounter (Signed)
Meds were refilled 08/02/2023.

## 2023-08-12 ENCOUNTER — Other Ambulatory Visit: Payer: Self-pay | Admitting: Family Medicine

## 2023-08-12 DIAGNOSIS — E1169 Type 2 diabetes mellitus with other specified complication: Secondary | ICD-10-CM

## 2023-08-12 DIAGNOSIS — E559 Vitamin D deficiency, unspecified: Secondary | ICD-10-CM

## 2023-08-15 ENCOUNTER — Other Ambulatory Visit: Payer: Self-pay

## 2023-08-23 ENCOUNTER — Encounter: Payer: Self-pay | Admitting: Family Medicine

## 2023-09-04 ENCOUNTER — Ambulatory Visit: Payer: Self-pay | Admitting: Family Medicine

## 2023-09-09 ENCOUNTER — Ambulatory Visit (INDEPENDENT_AMBULATORY_CARE_PROVIDER_SITE_OTHER): Payer: Self-pay | Admitting: Family Medicine

## 2023-09-09 ENCOUNTER — Encounter: Payer: Self-pay | Admitting: Family Medicine

## 2023-09-09 VITALS — BP 148/84 | HR 72 | Temp 98.2°F | Ht 64.75 in | Wt 193.5 lb

## 2023-09-09 DIAGNOSIS — E66811 Obesity, class 1: Secondary | ICD-10-CM

## 2023-09-09 DIAGNOSIS — E1140 Type 2 diabetes mellitus with diabetic neuropathy, unspecified: Secondary | ICD-10-CM

## 2023-09-09 DIAGNOSIS — Z7984 Long term (current) use of oral hypoglycemic drugs: Secondary | ICD-10-CM

## 2023-09-09 DIAGNOSIS — E1169 Type 2 diabetes mellitus with other specified complication: Secondary | ICD-10-CM

## 2023-09-09 DIAGNOSIS — E785 Hyperlipidemia, unspecified: Secondary | ICD-10-CM

## 2023-09-09 DIAGNOSIS — I10 Essential (primary) hypertension: Secondary | ICD-10-CM

## 2023-09-09 LAB — POCT GLYCOSYLATED HEMOGLOBIN (HGB A1C): Hemoglobin A1C: 6.7 % — AB (ref 4.0–5.6)

## 2023-09-09 MED ORDER — BLOOD GLUCOSE TEST VI STRP: 1.0000 | ORAL_STRIP | Freq: Three times a day (TID) | 3 refills | Status: AC

## 2023-09-09 MED ORDER — LANCET DEVICE MISC: 1.0000 | Freq: Three times a day (TID) | 0 refills | Status: AC

## 2023-09-09 MED ORDER — CARVEDILOL 3.125 MG PO TABS: 3.1250 mg | ORAL_TABLET | Freq: Two times a day (BID) | ORAL | 1 refills | Status: DC

## 2023-09-09 MED ORDER — METFORMIN HCL 500 MG PO TABS: 500.0000 mg | ORAL_TABLET | Freq: Every day | ORAL | 4 refills | Status: DC

## 2023-09-09 MED ORDER — BLOOD GLUCOSE MONITORING SUPPL DEVI: 1.0000 | Freq: Three times a day (TID) | 0 refills | Status: AC

## 2023-09-09 MED ORDER — LANCETS MISC. MISC: 1.0000 | Freq: Three times a day (TID) | 3 refills | Status: AC

## 2023-09-09 NOTE — Progress Notes (Unsigned)
Ph: [phone] Fax: [phone]   Patient ID: Amy [name], female    DOB: [birth year], 52 y.o.   MRN: [id number]  This visit was conducted in person.  BP (!) 148/84 (BP Location: Right Arm, Cuff Size: Large)   Pulse 72   Temp 98.2 F (36.8 C) (Oral)   Ht 5' 4.75" (1.645 m)   Wt 193 lb 8 oz (87.8 kg)   LMP 10/06/2014   SpO2 98%   BMI 32.45 kg/m    CC: follow up visit  Subjective:   HPI: Amy [name] is a 52 y.o. female presenting on 09/09/2023 for Medical Management of Chronic Issues (Here for DM/HTN f/u.)   Self pay patient.   2 months ago episode of palpitations lasting half a day. Started overnight.  Seen in [place] lisinopril hydrochlorothiazide 20/12.5mg  was increased to BID as well as nebivolol 5mg  1/2 tab daily and metformin XR 1 tab BID. Has had recent EKG in [place] and brings records. Doesn't know if she could afford heart monitor.   She's been using same glucometer and lancet as husband.   DM - does regularly check sugars 130-140 fasting, 110 in evenings. Compliant with antihyperglycemic regimen which includes: metformin 500mg  nightly. Denies low sugars or hypoglycemic symptoms. Denies blurry vision. Notes paresthesias to hands and feet. Last diabetic eye exam 06/2023. Glucometer brand: [employer]. Last foot exam: DUE. DSME: has not had - interested but worried about cost. Lab Results  Component Value Date   HGBA1C 6.7 (A) 09/09/2023   Diabetic Foot Exam - Simple   Simple Foot Form Diabetic Foot exam was performed with the following findings: Yes 09/09/2023  3:40 PM  Visual Inspection No deformities, no ulcerations, no other skin breakdown bilaterally: Yes Sensation Testing Intact to touch and monofilament testing bilaterally: Yes Pulse Check Posterior Tibialis and Dorsalis pulse intact bilaterally: Yes Comments No claudication    Lab Results  Component Value Date   MICROALBUR 3.0 (H) 04/16/2022        Relevant past medical,  surgical, family and social history reviewed and updated as indicated. Interim medical history since our last visit reviewed. Allergies and medications reviewed and updated. Outpatient Medications Prior to Visit  Medication Sig Dispense Refill   Continuous Blood Gluc Receiver (FREESTYLE LIBRE 2 READER) DEVI Use to check sugars daily E11.69 1 each 0   Continuous Glucose Sensor (FREESTYLE LIBRE 2 SENSOR) MISC USE AS DIRECTED TO CHECK BLOOD SUGAR 2 each 0   famotidine (PEPCID) 20 MG tablet Take 1 tablet (20 mg total) by mouth 2 (two) times daily. (Patient taking differently: Take 20 mg by mouth 2 (two) times daily. As needed)     losartan-hydrochlorothiazide (HYZAAR) 50-12.5 MG tablet Take 1 tablet by mouth daily. 90 tablet 0   metformin (FORTAMET) 500 MG (OSM) 24 hr tablet Take 500 mg by mouth at bedtime.     benzonatate (TESSALON PERLES) 100 MG capsule Take 1 capsule (100 mg total) by mouth 3 (three) times daily as needed for cough. 30 capsule 0   cyclobenzaprine (FLEXERIL) 10 MG tablet Take 0.5-1 tablets (5-10 mg total) by mouth 2 (two) times daily as needed for muscle spasms (sedation precautions). 30 tablet 0   predniSONE (DELTASONE) 20 MG tablet Take two tablets daily for 3 days followed by one tablet daily for 4 days 10 tablet 0   No facility-administered medications prior to visit.     Per HPI unless specifically indicated in ROS section below Review of Systems  Objective:  BP (!) 148/84 (BP Location: Right Arm, Cuff Size: Large)   Pulse 72   Temp 98.2 F (36.8 C) (Oral)   Ht 5' 4.75" (1.645 m)   Wt 193 lb 8 oz (87.8 kg)   LMP 10/06/2014   SpO2 98%   BMI 32.45 kg/m   Wt Readings from Last 3 Encounters:  09/09/23 193 lb 8 oz (87.8 kg)  05/03/23 191 lb (86.6 kg)  04/30/23 188 lb 8 oz (85.5 kg)      Physical Exam Vitals and nursing note reviewed.  Constitutional:      Appearance: Normal appearance. She is not ill-appearing.  HENT:     Mouth/Throat:     Mouth: Mucous  membranes are moist.     Pharynx: Oropharynx is clear. No oropharyngeal exudate or posterior oropharyngeal erythema.  Eyes:     Extraocular Movements: Extraocular movements intact.     Pupils: Pupils are equal, round, and reactive to light.  Cardiovascular:     Rate and Rhythm: Normal rate and regular rhythm.     Pulses: Normal pulses.     Heart sounds: Normal heart sounds. No murmur heard. Pulmonary:     Effort: Pulmonary effort is normal. No respiratory distress.     Breath sounds: Normal breath sounds. No wheezing, rhonchi or rales.  Musculoskeletal:     Right lower leg: No edema.     Left lower leg: No edema.  Skin:    General: Skin is warm and dry.     Findings: No rash.  Neurological:     Mental Status: She is alert.  Psychiatric:        Mood and Affect: Mood normal.        Behavior: Behavior normal.       Results for orders placed or performed in visit on 09/09/23  POCT glycosylated hemoglobin (Hb A1C)  Result Value Ref Range   Hemoglobin A1C 6.7 (A) 4.0 - 5.6 %   HbA1c POC (<> result, manual entry)     HbA1c, POC (prediabetic range)     HbA1c, POC (controlled diabetic range)      Assessment & Plan:   Problem List Items Addressed This Visit     Type 2 diabetes mellitus with other specified complication (HCC) - Primary   Relevant Medications   metFORMIN (GLUCOPHAGE) 500 MG tablet   Other Relevant Orders   POCT glycosylated hemoglobin (Hb A1C) (Completed)     Meds ordered this encounter  Medications   Blood Glucose Monitoring Suppl DEVI    Sig: 1 each by Does not apply route in the morning, at noon, and at bedtime. May substitute to any manufacturer covered by patient's insurance.    Dispense:  1 each    Refill:  0   Glucose Blood (BLOOD GLUCOSE TEST STRIPS) STRP    Sig: 1 each by In Vitro route in the morning, at noon, and at bedtime. May substitute to any manufacturer covered by patient's insurance.    Dispense:  100 strip    Refill:  3   Lancet Device  MISC    Sig: 1 each by Does not apply route in the morning, at noon, and at bedtime. May substitute to any manufacturer covered by patient's insurance.    Dispense:  1 each    Refill:  0   Lancets Misc. MISC    Sig: 1 each by Does not apply route in the morning, at noon, and at bedtime. May substitute to any manufacturer covered by  patient's insurance.    Dispense:  100 each    Refill:  3   carvedilol (COREG) 3.125 MG tablet    Sig: Take 1 tablet (3.125 mg total) by mouth 2 (two) times daily with a meal.    Dispense:  180 tablet    Refill:  1   metFORMIN (GLUCOPHAGE) 500 MG tablet    Sig: Take 1 tablet (500 mg total) by mouth daily with breakfast.    Dispense:  90 tablet    Refill:  4    Orders Placed This Encounter  Procedures   POCT glycosylated hemoglobin (Hb A1C)    Patient Instructions  Seguir metformina 500mg   Seguir losartan 50/ hydrochlorothiazide 12.5mg  diario. Aadir carvedilol 3.125mg  dos veces al dia.  Azucar esta bien con solo 1 metformina al dia.  Regesar en 3 meses para proxima visita  Follow up plan: Return in about 3 months (around 12/10/2023) for annual exam, prior fasting for blood work.  Eustaquio Boyden, MD

## 2023-09-09 NOTE — Patient Instructions (Addendum)
Seguir metformina 500mg   Seguir losartan 50/ hydrochlorothiazide 12.5mg  diario. Aadir carvedilol 3.125mg  dos veces al dia.  Azucar esta bien con solo 1 metformina al dia.  Regesar en 3 meses para proxima visita

## 2023-09-10 ENCOUNTER — Encounter: Payer: Self-pay | Admitting: Family Medicine

## 2023-09-10 DIAGNOSIS — E1140 Type 2 diabetes mellitus with diabetic neuropathy, unspecified: Secondary | ICD-10-CM | POA: Insufficient documentation

## 2023-09-10 NOTE — Assessment & Plan Note (Addendum)
Chronic, good control on metformin IR once daily - continue this.  Recommend use different glucometer than husband, discussed need to use different lancet as well and to change lancets each sugar check.

## 2023-09-10 NOTE — Assessment & Plan Note (Signed)
Encourage healthy diet and lifestyle choices to affect sustainable weight loss

## 2023-09-10 NOTE — Assessment & Plan Note (Addendum)
She is not currently on statin.  The 10-year ASCVD risk score (Arnett DK, et al., 2019) is: 5.3%*   Values used to calculate the score:     Age: 52 years     Sex: Female     Is Non-Hispanic African American: No     Diabetic: Yes     Tobacco smoker: No     Systolic Blood Pressure: 148 mmHg     Is BP treated: Yes     HDL Cholesterol: 45 mg/dL*     Total Cholesterol: 195 mg/dL*     * - Cholesterol units were assumed for this score calculation

## 2023-09-10 NOTE — Assessment & Plan Note (Signed)
Chronic, BP mildly elevated on losartan hydrochlorothiazide 50/12.5mg  once daily - will add carvedilol 3.125mg  BID.

## 2023-09-10 NOTE — Assessment & Plan Note (Signed)
Describes this. Will need updated B12 next labwork.

## 2023-09-11 ENCOUNTER — Other Ambulatory Visit: Payer: Self-pay | Admitting: Family Medicine

## 2023-09-11 DIAGNOSIS — I10 Essential (primary) hypertension: Secondary | ICD-10-CM

## 2023-09-11 NOTE — Telephone Encounter (Signed)
ERx 

## 2023-10-01 ENCOUNTER — Encounter: Payer: Self-pay | Admitting: Family Medicine

## 2024-01-04 ENCOUNTER — Other Ambulatory Visit: Payer: Self-pay | Admitting: Family Medicine

## 2024-01-04 DIAGNOSIS — E1169 Type 2 diabetes mellitus with other specified complication: Secondary | ICD-10-CM

## 2024-01-06 NOTE — Telephone Encounter (Signed)
 Lvm to schedule

## 2024-01-06 NOTE — Telephone Encounter (Signed)
 E-scribed refill.  Per 09/09/23 OV notes, pt was to rtn for 3 mo DM f/u around 12/10/23. Plz schedule DM f/u.

## 2024-01-07 NOTE — Telephone Encounter (Signed)
 Noted.

## 2024-01-07 NOTE — Telephone Encounter (Signed)
 Patient has been schedule.

## 2024-01-31 ENCOUNTER — Other Ambulatory Visit: Payer: Self-pay | Admitting: Family Medicine

## 2024-01-31 DIAGNOSIS — I10 Essential (primary) hypertension: Secondary | ICD-10-CM

## 2024-01-31 DIAGNOSIS — E1169 Type 2 diabetes mellitus with other specified complication: Secondary | ICD-10-CM

## 2024-02-12 ENCOUNTER — Encounter: Payer: Self-pay | Admitting: Family Medicine

## 2024-02-12 ENCOUNTER — Ambulatory Visit (INDEPENDENT_AMBULATORY_CARE_PROVIDER_SITE_OTHER): Payer: Self-pay | Admitting: Family Medicine

## 2024-02-12 VITALS — BP 152/90 | HR 74 | Temp 98.8°F | Ht 63.5 in | Wt 192.2 lb

## 2024-02-12 DIAGNOSIS — E1169 Type 2 diabetes mellitus with other specified complication: Secondary | ICD-10-CM

## 2024-02-12 DIAGNOSIS — Z7984 Long term (current) use of oral hypoglycemic drugs: Secondary | ICD-10-CM

## 2024-02-12 DIAGNOSIS — I10 Essential (primary) hypertension: Secondary | ICD-10-CM

## 2024-02-12 LAB — POCT GLYCOSYLATED HEMOGLOBIN (HGB A1C): Hemoglobin A1C: 6.3 % — AB (ref 4.0–5.6)

## 2024-02-12 MED ORDER — FREESTYLE LIBRE 3 PLUS SENSOR MISC: 6 refills | Status: DC

## 2024-02-12 MED ORDER — CARVEDILOL 3.125 MG PO TABS: 3.1250 mg | ORAL_TABLET | Freq: Two times a day (BID) | ORAL | 2 refills | Status: DC

## 2024-02-12 MED ORDER — LOSARTAN POTASSIUM-HCTZ 50-12.5 MG PO TABS: 1.0000 | ORAL_TABLET | Freq: Every day | ORAL | 2 refills | Status: DC

## 2024-02-12 NOTE — Assessment & Plan Note (Signed)
 Chronic, BP remaining elevated despite current regimen of hyzaar 50/12.5mg  and carvedilol  3.125mg  bid.  However she notes wide fluctuations at home - attributes to stress.  I gave her BP log sheet to start tracking blood pressures at home and drop off in 2 wks to review readings. Further med titration pending this review

## 2024-02-12 NOTE — Assessment & Plan Note (Addendum)
 Chronic, overall well controlled on metformin  500mg  nightly Reviewed diet choices and effect on glycemic control.  She will price out CGM freestyle libre 3 plus - coupon provided today.  A1c remains well controlled at 6.3% - improvement from prior.

## 2024-02-12 NOTE — Patient Instructions (Addendum)
 A1c today.  Revise costo de [employer] 3 plus - mande a su farmacia.  Regresar en 6 meses para fisico.

## 2024-02-12 NOTE — Progress Notes (Signed)
 Ph: [phone] Fax: [phone]   Patient ID: Amy [name], female    DOB: [birth year], 53 y.o.   MRN: [id number]  This visit was conducted in person.  BP (!) 152/90   Pulse 74   Temp 98.8 F (37.1 C) (Oral)   Ht 5' 3.5" (1.613 m)   Wt 192 lb 4 oz (87.2 kg)   LMP 10/06/2014   SpO2 97%   BMI 33.52 kg/m   160/90 on recheck.   CC: CPE - converted to DM f/u  Subjective:   HPI: Amy [name] is a 53 y.o. female presenting on 02/12/2024 for Annual Exam   HTN - continues hyzaar 50/12.5mg  daily, carvedilol  3.125mg  bid.   DM - does regularly check sugars - see below. Compliant with antihyperglycemic regimen which includes: metformin  500mg  daily. Denies low sugars or hypoglycemic symptoms. Denies paresthesias, blurry vision. Last diabetic eye exam 06/2023. Glucometer brand: freestyle libre 2. Last foot exam: 08/2023. DSME: declines. Finds white rice causes hyperglycemia.  CGM data: freestyle libre 2 30 day average sugar: 123, time in range 98%, hypoglycemia 0%, stage 1 hyperglycemia 2%, stage 2 hyperglycemia 0%.  Lab Results  Component Value Date   HGBA1C 6.3 (A) 02/12/2024   Diabetic Foot Exam - Simple   No data filed    Lab Results  Component Value Date   MICROALBUR 3.0 (H) 04/16/2022     Gets preventative healthcare in [place] - planning trip Oct 2025. Wants to return for CPE after this.   Preventative: COLONOSCOPY 01/2018 - proximal sigmoid colon inflammation - biopsy negative ([facility]) Mammogram - due for this. Planning to get done in [place] Fall 2025 Well woman with  OBGYN DEXA scan - rec baseline at 52yo due to h/o premature menopause (age 72) Lung cancer screening -  Flu shot - COVID shot -  Tetanus shot -  Pneumonia shot -  Shingrix -  Advanced directive discussion -  Seat belt use discussed Sunscreen use discussed. No changing moles on skin. Dentist -  Eye exam -  Smoking -  Alcohol  -  Bowel -  Bladder -        Relevant  past medical, surgical, family and social history reviewed and updated as indicated. Interim medical history since our last visit reviewed. Allergies and medications reviewed and updated. Outpatient Medications Prior to Visit  Medication Sig Dispense Refill   Blood Glucose Monitoring Suppl DEVI 1 each by Does not apply route in the morning, at noon, and at bedtime. May substitute to any manufacturer covered by patient's insurance. 1 each 0   Continuous Blood Gluc Receiver (FREESTYLE LIBRE 2 READER) DEVI Use to check sugars daily E11.69 1 each 0   famotidine  (PEPCID ) 20 MG tablet Take 1 tablet (20 mg total) by mouth 2 (two) times daily. (Patient taking differently: Take 20 mg by mouth 2 (two) times daily. As needed)     metFORMIN  (GLUCOPHAGE ) 500 MG tablet Take 1 tablet (500 mg total) by mouth daily with breakfast. 90 tablet 4   carvedilol  (COREG ) 3.125 MG tablet TAKE 1 TABLET BY MOUTH TWICE DAILY WITH MEALS 180 tablet 0   Continuous Glucose Sensor (FREESTYLE LIBRE 2 SENSOR) MISC USE AS DIRECTED TO CHECK BLOOD SUGAR 2 each 0   losartan -hydrochlorothiazide  (HYZAAR) 50-12.5 MG tablet Take 1 tablet by mouth once daily 90 tablet 1   No facility-administered medications prior to visit.     Per HPI unless specifically indicated in ROS section below Review of  Systems  Objective:  BP (!) 152/90   Pulse 74   Temp 98.8 F (37.1 C) (Oral)   Ht 5' 3.5" (1.613 m)   Wt 192 lb 4 oz (87.2 kg)   LMP 10/06/2014   SpO2 97%   BMI 33.52 kg/m   Wt Readings from Last 3 Encounters:  02/12/24 192 lb 4 oz (87.2 kg)  09/09/23 193 lb 8 oz (87.8 kg)  05/03/23 191 lb (86.6 kg)      Physical Exam Vitals and nursing note reviewed.  Constitutional:      Appearance: Normal appearance. She is not ill-appearing.  Neurological:     Mental Status: She is alert.  Psychiatric:        Mood and Affect: Mood normal.        Behavior: Behavior normal.       Results for orders placed or performed in visit on  02/12/24  POCT glycosylated hemoglobin (Hb A1C)   Collection Time: 02/12/24  4:20 PM  Result Value Ref Range   Hemoglobin A1C 6.3 (A) 4.0 - 5.6 %   HbA1c POC (<> result, manual entry)     HbA1c, POC (prediabetic range)     HbA1c, POC (controlled diabetic range)      Assessment & Plan:   Problem List Items Addressed This Visit     Type 2 diabetes mellitus with other specified complication (HCC) - Primary   Chronic, overall well controlled on metformin  500mg  nightly Reviewed diet choices and effect on glycemic control.  She will price out CGM freestyle libre 3 plus - coupon provided today.  A1c remains well controlled at 6.3% - improvement from prior.       Relevant Medications   losartan -hydrochlorothiazide  (HYZAAR) 50-12.5 MG tablet   Other Relevant Orders   POCT glycosylated hemoglobin (Hb A1C) (Completed)   Essential hypertension   Chronic, BP remaining elevated despite current regimen of hyzaar 50/12.5mg  and carvedilol  3.125mg  bid.  However she notes wide fluctuations at home - attributes to stress.  I gave her BP log sheet to start tracking blood pressures at home and drop off in 2 wks to review readings. Further med titration pending this review      Relevant Medications   carvedilol  (COREG ) 3.125 MG tablet   losartan -hydrochlorothiazide  (HYZAAR) 50-12.5 MG tablet     Meds ordered this encounter  Medications   carvedilol  (COREG ) 3.125 MG tablet    Sig: Take 1 tablet (3.125 mg total) by mouth 2 (two) times daily with a meal.    Dispense:  180 tablet    Refill:  2   losartan -hydrochlorothiazide  (HYZAAR) 50-12.5 MG tablet    Sig: Take 1 tablet by mouth daily.    Dispense:  90 tablet    Refill:  2   Continuous Glucose Sensor (FREESTYLE LIBRE 3 PLUS SENSOR) MISC    Sig: Change sensor every 15 days.    Dispense:  2 each    Refill:  6    Orders Placed This Encounter  Procedures   POCT glycosylated hemoglobin (Hb A1C)    Patient Instructions  A1c today.  Revise  costo de [employer] 3 plus - mande a su farmacia.  Regresar en 6 meses para fisico.   Follow up plan: Return in about 6 months (around 08/13/2024) for annual exam, prior fasting for blood work.  Claire Crick, MD

## 2024-07-15 HISTORY — PX: ESOPHAGOGASTRODUODENOSCOPY: SHX1529

## 2024-07-15 HISTORY — PX: COLONOSCOPY: SHX174

## 2024-08-27 ENCOUNTER — Encounter: Payer: Self-pay | Admitting: Family Medicine

## 2024-09-16 ENCOUNTER — Encounter: Payer: Self-pay | Admitting: Family Medicine

## 2024-09-16 ENCOUNTER — Ambulatory Visit (INDEPENDENT_AMBULATORY_CARE_PROVIDER_SITE_OTHER): Payer: Self-pay | Admitting: Family Medicine

## 2024-09-16 VITALS — BP 175/101 | HR 77 | Temp 98.2°F | Ht 64.0 in | Wt 190.4 lb

## 2024-09-16 DIAGNOSIS — I10 Essential (primary) hypertension: Secondary | ICD-10-CM

## 2024-09-16 DIAGNOSIS — K582 Mixed irritable bowel syndrome: Secondary | ICD-10-CM

## 2024-09-16 DIAGNOSIS — K219 Gastro-esophageal reflux disease without esophagitis: Secondary | ICD-10-CM

## 2024-09-16 DIAGNOSIS — E1169 Type 2 diabetes mellitus with other specified complication: Secondary | ICD-10-CM

## 2024-09-16 LAB — POCT GLYCOSYLATED HEMOGLOBIN (HGB A1C): Hemoglobin A1C: 6.4 % — AB (ref 4.0–5.6)

## 2024-09-16 MED ORDER — FREESTYLE LIBRE 3 PLUS SENSOR MISC: 1 refills | Status: AC

## 2024-09-16 MED ORDER — METFORMIN HCL 500 MG PO TABS: 500.0000 mg | ORAL_TABLET | Freq: Every day | ORAL | 3 refills | Status: AC

## 2024-09-16 MED ORDER — LOSARTAN POTASSIUM-HCTZ 100-25 MG PO TABS: 1.0000 | ORAL_TABLET | Freq: Every day | ORAL | 3 refills | Status: AC

## 2024-09-16 MED ORDER — CARVEDILOL 3.125 MG PO TABS: 3.1250 mg | ORAL_TABLET | Freq: Two times a day (BID) | ORAL | 3 refills | Status: AC

## 2024-09-16 NOTE — Progress Notes (Signed)
 Ph: [phone] Fax: [phone]   Patient ID: Amy [name] [place], female    DOB: Jan 26, 1971, 53 y.o.   MRN: [id number]  This visit was conducted in person.  BP (!) 175/101 (BP Location: Left Arm) Comment: pt's bp device  Pulse 77   Temp 98.2 F (36.8 C) (Oral)   Ht 5' 4 (1.626 m)   Wt 190 lb 6.4 oz (86.4 kg)   LMP 10/06/2014   SpO2 98%   BMI 32.68 kg/m    160s/100 on repeat testing Comparable readings with home BP cuff.   BP Readings from Last 3 Encounters:  09/16/24 (!) 175/101  02/12/24 (!) 152/90  09/09/23 (!) 148/84   CC: 6 mo f/u visit  Subjective:   HPI: WILHELMENA [name] is a 53 y.o. female presenting on 09/16/2024 for Medical Management of Chronic Issues (Pt would like to go over prescriptions, blood work)   New job - now working for [employer] - she is enjoying this job.   She went to [place] and had multiple tests done including labwork, head CT, EGD, colonoscopy and states she tested negative for H pylori.  Brings some records, will send lab results via mychart.  She was also prescribed pantoprazole (Tecta) 40mg  daily as well as psyllium and alevian duo antispasmodic for IBS.   DM - does  regularly check sugars - see below. Compliant with antihyperglycemic regimen which includes: metformin  500mg  daily. Denies low sugars or hypoglycemic symptoms. Denies paresthesias, blurry vision. Last diabetic eye exam DUE. Glucometer brand: FL3+ as well as glucometer of unsure brand. Last foot exam: DUE. DSME: unsure. CGM data: FL3+ 30 day average sugar: 115, time in range 98%, hypoglycemia 1%, stage 1 hyperglycemia 1%, stage 2 hyperglycemia 0%.  Lab Results  Component Value Date   HGBA1C 6.4 (A) 09/16/2024   Diabetic Foot Exam - Simple   Simple Foot Form Diabetic Foot exam was performed with the following findings: Yes 09/16/2024  4:31 PM  Visual Inspection No deformities, no ulcerations, no other skin breakdown bilaterally: Yes Sensation Testing Intact to touch and  monofilament testing bilaterally: Yes Pulse Check Posterior Tibialis and Dorsalis pulse intact bilaterally: Yes Comments No claudication    Lab Results  Component Value Date   MICROALBUR 0.27 06/29/2008     HTN - Compliant with current antihypertensive regimen of carvedilol  3.125mg  bid, losartan  hydrochlorothiazide  50/12.5mg  daily. Does check blood pressures at home: 130-160s/80-100s.  No low blood pressure readings or symptoms of dizziness/syncope.  Denies vision changes, CP/tightness, SOB, leg swelling.  + HA.   Done in [place]: EGD 07/2024: moderate chronic active gastritis type B, duodenogastric biliary reflux, biopsies taken (no results available) (done in [place]) Colonoscopy 07/2024: diverticulosis, int hem, no polyps ([place])     Relevant past medical, surgical, family and social history reviewed and updated as indicated. Interim medical history since our last visit reviewed. Allergies and medications reviewed and updated. Outpatient Medications Prior to Visit  Medication Sig Dispense Refill   Blood Glucose Monitoring Suppl DEVI 1 each by Does not apply route in the morning, at noon, and at bedtime. May substitute to any manufacturer covered by patient's insurance. 1 each 0   famotidine  (PEPCID ) 20 MG tablet Take 1 tablet (20 mg total) by mouth 2 (two) times daily. (Patient taking differently: Take 20 mg by mouth 2 (two) times daily. As needed)     carvedilol  (COREG ) 3.125 MG tablet Take 1 tablet (3.125 mg total) by mouth 2 (two) times daily  with a meal. 180 tablet 2   Continuous Glucose Sensor (FREESTYLE LIBRE 3 PLUS SENSOR) MISC Change sensor every 15 days. 2 each 6   losartan -hydrochlorothiazide  (HYZAAR) 50-12.5 MG tablet Take 1 tablet by mouth daily. 90 tablet 2   metFORMIN  (GLUCOPHAGE ) 500 MG tablet Take 1 tablet (500 mg total) by mouth daily with breakfast. 90 tablet 4   Continuous Blood Gluc Receiver (FREESTYLE LIBRE 2 READER) DEVI Use to check sugars daily  E11.69 (Patient not taking: Reported on 09/16/2024) 1 each 0   No facility-administered medications prior to visit.     Per HPI unless specifically indicated in ROS section below Review of Systems  Objective:  BP (!) 175/101 (BP Location: Left Arm) Comment: pt's bp device  Pulse 77   Temp 98.2 F (36.8 C) (Oral)   Ht 5' 4 (1.626 m)   Wt 190 lb 6.4 oz (86.4 kg)   LMP 10/06/2014   SpO2 98%   BMI 32.68 kg/m   Wt Readings from Last 3 Encounters:  09/16/24 190 lb 6.4 oz (86.4 kg)  02/12/24 192 lb 4 oz (87.2 kg)  09/09/23 193 lb 8 oz (87.8 kg)      Physical Exam Vitals and nursing note reviewed.  Constitutional:      Appearance: Normal appearance. She is not ill-appearing.  HENT:     Head: Normocephalic and atraumatic.     Mouth/Throat:     Mouth: Mucous membranes are moist.     Pharynx: Oropharynx is clear. No oropharyngeal exudate or posterior oropharyngeal erythema.  Eyes:     Extraocular Movements: Extraocular movements intact.     Conjunctiva/sclera: Conjunctivae normal.     Pupils: Pupils are equal, round, and reactive to light.  Cardiovascular:     Rate and Rhythm: Normal rate and regular rhythm.     Pulses: Normal pulses.     Heart sounds: Normal heart sounds. No murmur heard. Pulmonary:     Effort: Pulmonary effort is normal. No respiratory distress.     Breath sounds: Normal breath sounds. No wheezing, rhonchi or rales.  Musculoskeletal:     Right lower leg: No edema.     Left lower leg: No edema.  Skin:    General: Skin is warm and dry.     Findings: No rash.  Neurological:     Mental Status: She is alert.  Psychiatric:        Mood and Affect: Mood normal.        Behavior: Behavior normal.       Results for orders placed or performed in visit on 09/16/24  HgB A1c   Collection Time: 09/16/24  4:22 PM  Result Value Ref Range   Hemoglobin A1C 6.4 (A) 4.0 - 5.6 %   HbA1c POC (<> result, manual entry)     HbA1c, POC (prediabetic range)     HbA1c, POC  (controlled diabetic range)     Lab Results  Component Value Date   NA 142 08/09/2023   NA 142 08/09/2023   CL 102 05/03/2023   K 4.0 08/09/2023   K 4.0 08/09/2023   CO2 29 05/03/2023   BUN 15 05/03/2023   CREATININE 0.7 08/09/2023   CREATININE 0.7 08/09/2023   GFR 108.05 04/16/2022   CALCIUM  9.4 05/03/2023   ALBUMIN 4.3 04/16/2022   GLUCOSE 109 (H) 05/03/2023    Lab Results  Component Value Date   WBC 6.6 08/09/2023   HGB 13.9 08/09/2023   HGB 13.9 08/09/2023   HCT 42  08/09/2023   MCV 91.2 05/03/2023   PLT 287 08/09/2023   Lab Results  Component Value Date   TSH 2.71 08/20/2019    Lab Results  Component Value Date   VITAMINB12 336 04/16/2022   Assessment & Plan:   Problem List Items Addressed This Visit     Type 2 diabetes mellitus with other specified complication (HCC) - Primary   Chronic, great control based on A1c. Continue metformin  once daily.  FL3+ sample provided today along with discount coupon card.      Relevant Medications   metFORMIN  (GLUCOPHAGE ) 500 MG tablet   losartan -hydrochlorothiazide  (HYZAAR) 100-25 MG tablet   Other Relevant Orders   HgB A1c (Completed)   Essential hypertension   Chronic, BP markedly elevated despite compliance with current regimen with increase in headaches.  Will increase losartan  to 100/25mg  daily, continue carvedilol  3.125mg  bid.  Reviewed diet choices to improve bp control (low salt/sodium, increased potassium rich foods). Encouraged increased aerobic exercise, work on weight loss as well.  RTC 1 mo HTN f/u visit.       Relevant Medications   carvedilol  (COREG ) 3.125 MG tablet   losartan -hydrochlorothiazide  (HYZAAR) 100-25 MG tablet   GERD   Now on pantoprazole 40mg  daily prescribed in [place] she tested negative for H pylori 2025, husband tested positive.       Irritable bowel syndrome (IBS)   Using antispasmodic/anti-gas Alevian Duo prescribed in [place] with benefit        Meds  ordered this encounter  Medications   carvedilol  (COREG ) 3.125 MG tablet    Sig: Take 1 tablet (3.125 mg total) by mouth 2 (two) times daily with a meal.    Dispense:  180 tablet    Refill:  3   metFORMIN  (GLUCOPHAGE ) 500 MG tablet    Sig: Take 1 tablet (500 mg total) by mouth daily with breakfast.    Dispense:  90 tablet    Refill:  3   losartan -hydrochlorothiazide  (HYZAAR) 100-25 MG tablet    Sig: Take 1 tablet by mouth daily.    Dispense:  90 tablet    Refill:  3   Continuous Glucose Sensor (FREESTYLE LIBRE 3 PLUS SENSOR) MISC    Sig: Change sensor every 15 days.    Dispense:  6 each    Refill:  1    Orders Placed This Encounter  Procedures   HgB A1c    Patient Instructions  Azucar sigue muy bien controlada. Presion arterial esta muy alta - suba dosis de losartan /hydrochlorothiazide  a 100/25mg  diarios. Puede tomar 2 tabletas de 50/12.5mg  por las maanas. He mandado nueva dosis de 100/25mg  a su farmacia.   Mas fruta, verdura y granos - comida rica en potassio.  Menos sal (limite sodio a <2 gramos/dia) Mas ejercicio durante el dia - perdida de peso ayudara bajar la presion.   Regresar en 1 mes para revisar presion arterial de nuevo.   Follow up plan: Return in about 1 month (around 10/17/2024), or if symptoms worsen or fail to improve, for follow up visit.  Anton Blas, MD

## 2024-09-16 NOTE — Patient Instructions (Signed)
 Amy [name] sigue muy bien controlada. Presion arterial esta muy alta - suba dosis de losartan /hydrochlorothiazide  a 100/25mg  diarios. Puede tomar 2 tabletas de 50/12.5mg  por las maanas. He mandado nueva dosis de 100/25mg  a su farmacia.   Mas fruta, verdura y granos - comida rica en potassio.  Menos sal (limite sodio a <2 gramos/dia) Mas ejercicio durante el dia - perdida de peso ayudara bajar la presion.   Regresar en 1 mes para revisar presion arterial de nuevo.

## 2024-09-17 ENCOUNTER — Ambulatory Visit: Payer: Self-pay | Admitting: Family Medicine

## 2024-09-18 NOTE — Assessment & Plan Note (Signed)
 Chronic, great control based on A1c. Continue metformin  once daily.  FL3+ sample provided today along with discount coupon card.

## 2024-09-18 NOTE — Assessment & Plan Note (Addendum)
 Chronic, BP markedly elevated despite compliance with current regimen with increase in headaches.  Will increase losartan  to 100/25mg  daily, continue carvedilol  3.125mg  bid.  Reviewed diet choices to improve bp control (low salt/sodium, increased potassium rich foods). Encouraged increased aerobic exercise, work on weight loss as well.  RTC 1 mo HTN f/u visit.

## 2024-09-19 ENCOUNTER — Encounter: Payer: Self-pay | Admitting: Family Medicine

## 2024-09-19 NOTE — Assessment & Plan Note (Addendum)
 Now on pantoprazole 40mg  daily prescribed in [place] she tested negative for H pylori 2025, husband tested positive.

## 2024-09-19 NOTE — Assessment & Plan Note (Signed)
 Using antispasmodic/anti-gas Alevian Duo prescribed in [place] with benefit

## 2024-09-22 LAB — OPHTHALMOLOGY REPORT-SCANNED

## 2024-09-23 ENCOUNTER — Encounter: Payer: Self-pay | Admitting: Family Medicine

## 2024-10-12 LAB — HM COLONOSCOPY

## 2024-10-16 ENCOUNTER — Encounter: Payer: Self-pay | Admitting: Family Medicine

## 2024-10-16 ENCOUNTER — Ambulatory Visit (INDEPENDENT_AMBULATORY_CARE_PROVIDER_SITE_OTHER): Payer: Self-pay | Admitting: Family Medicine

## 2024-10-16 VITALS — BP 136/84 | HR 72 | Temp 97.8°F | Ht 64.0 in | Wt 195.2 lb

## 2024-10-16 DIAGNOSIS — I10 Essential (primary) hypertension: Secondary | ICD-10-CM

## 2024-10-16 NOTE — Patient Instructions (Addendum)
 Feliz cumpleaos! Siga medicinas incluyendo mas alta dosis de losartan /hydrochlorothiazide   Regresar en 5 meses para proxima visita.

## 2024-10-16 NOTE — Progress Notes (Signed)
 " Ph: [phone] Fax: [phone]   Patient ID: Amy [name] [place], female    DOB: October 25, 1970, 54 y.o.   MRN: [id number]  This visit was conducted in person.  BP 136/84 (BP Location: Right Arm, Cuff Size: Normal)   Pulse 72   Temp 97.8 F (36.6 C) (Oral)   Ht 5' 4 (1.626 m)   Wt 195 lb 3.2 oz (88.5 kg)   LMP 10/06/2014   SpO2 96%   BMI 33.51 kg/m   BP Readings from Last 3 Encounters:  10/16/24 136/84  09/16/24 (!) 175/101  02/12/24 (!) 152/90    CC: 1 week f/u visit HTN Subjective:   HPI: Amy [name] is a 54 y.o. female presenting on 10/16/2024 for Medical Management of Chronic Issues (FU on Hypertension, pt reports feeling better/BP log shows bp have decreased over time/)   HTN - Compliant with current antihypertensive regimen of carvedilol  3.125mg  bid, hyzaar 100/25mg  daily (recent doubled dose).  Does  check blood pressures at home: see log below.  No low blood pressure readings or symptoms of dizziness/syncope.  Denies HA, vision changes, CP/tightness, SOB, leg swelling. Notes decreased walking since on vacation the past 2 weeks.   She continues alevian duo and has pantoprazole from [place] available.  As well as piascledine for neck OA.        Relevant past medical, surgical, family and social history reviewed and updated as indicated. Interim medical history since our last visit reviewed. Allergies and medications reviewed and updated. Outpatient Medications Prior to Visit  Medication Sig Dispense Refill   Blood Glucose Monitoring Suppl DEVI 1 each by Does not apply route in the morning, at noon, and at bedtime. May substitute to any manufacturer covered by patient's insurance. 1 each 0   carvedilol  (COREG ) 3.125 MG tablet Take 1 tablet (3.125 mg total) by mouth 2 (two) times daily with a meal. 180 tablet 3   Continuous Glucose Sensor (FREESTYLE LIBRE 3 PLUS SENSOR) MISC Change sensor every 15 days. 6 each 1   losartan -hydrochlorothiazide  (HYZAAR) 100-25 MG  tablet Take 1 tablet by mouth daily. 90 tablet 3   metFORMIN  (GLUCOPHAGE ) 500 MG tablet Take 1 tablet (500 mg total) by mouth daily with breakfast. 90 tablet 3   Continuous Blood Gluc Receiver (FREESTYLE LIBRE 2 READER) DEVI Use to check sugars daily E11.69 (Patient not taking: Reported on 10/16/2024) 1 each 0   famotidine  (PEPCID ) 20 MG tablet Take 1 tablet (20 mg total) by mouth 2 (two) times daily. (Patient not taking: Reported on 10/16/2024)     No facility-administered medications prior to visit.     Per HPI unless specifically indicated in ROS section below Review of Systems  Objective:  BP 136/84 (BP Location: Right Arm, Cuff Size: Normal)   Pulse 72   Temp 97.8 F (36.6 C) (Oral)   Ht 5' 4 (1.626 m)   Wt 195 lb 3.2 oz (88.5 kg)   LMP 10/06/2014   SpO2 96%   BMI 33.51 kg/m   Wt Readings from Last 3 Encounters:  10/16/24 195 lb 3.2 oz (88.5 kg)  09/16/24 190 lb 6.4 oz (86.4 kg)  02/12/24 192 lb 4 oz (87.2 kg)      Physical Exam Vitals and nursing note reviewed.  Constitutional:      Appearance: Normal appearance. She is not ill-appearing.  HENT:     Head: Normocephalic and atraumatic.     Mouth/Throat:     Mouth: Mucous membranes are moist.  Pharynx: Oropharynx is clear. No oropharyngeal exudate or posterior oropharyngeal erythema.  Eyes:     Extraocular Movements: Extraocular movements intact.     Pupils: Pupils are equal, round, and reactive to light.  Cardiovascular:     Rate and Rhythm: Normal rate and regular rhythm.     Pulses: Normal pulses.     Heart sounds: Normal heart sounds. No murmur heard. Pulmonary:     Effort: Pulmonary effort is normal. No respiratory distress.     Breath sounds: Normal breath sounds. No wheezing, rhonchi or rales.  Musculoskeletal:     Cervical back: Normal range of motion and neck supple.     Right lower leg: No edema.     Left lower leg: No edema.  Skin:    General: Skin is warm and dry.     Findings: No rash.   Neurological:     Mental Status: She is alert.  Psychiatric:        Mood and Affect: Mood normal.        Behavior: Behavior normal.       Results for orders placed or performed in visit on 10/12/24  HM COLONOSCOPY   Collection Time: 10/12/24  1:31 PM  Result Value Ref Range   HM Colonoscopy See Report (in chart) See Report (in chart), Patient Reported   Lab Results  Component Value Date   HGBA1C 6.4 (A) 09/16/2024   Lab Results  Component Value Date   NA 142 08/09/2023   NA 142 08/09/2023   CL 102 05/03/2023   K 4.0 08/09/2023   K 4.0 08/09/2023   CO2 29 05/03/2023   BUN 15 05/03/2023   CREATININE 0.7 08/09/2023   CREATININE 0.7 08/09/2023   GFR 108.05 04/16/2022   CALCIUM  9.4 05/03/2023   ALBUMIN 4.3 04/16/2022   GLUCOSE 109 (H) 05/03/2023    Assessment & Plan:   Problem List Items Addressed This Visit     Essential hypertension - Primary   Chronic, improving control on higher dose arb/hydrochlorothiazide  - continue.  Home readings largely improved this week however PM levels still elevated - in setting of 2 wks vacation, less walking, dietary liberties.         No orders of the defined types were placed in this encounter.   No orders of the defined types were placed in this encounter.   Patient Instructions  Odell cumpleaos! Siga medicinas incluyendo mas alta dosis de losartan /hydrochlorothiazide   Regresar en 5 meses para proxima visita.   Follow up plan: Return in about 5 months (around 03/16/2025), or if symptoms worsen or fail to improve, for follow up visit.  Anton Blas, MD   "

## 2024-10-16 NOTE — Assessment & Plan Note (Addendum)
 Chronic, improving control on higher dose arb/hydrochlorothiazide  - continue.  Home readings largely improved this week however PM levels still elevated - in setting of 2 wks vacation, less walking, dietary liberties.

## 2025-03-16 ENCOUNTER — Ambulatory Visit: Payer: Self-pay | Admitting: Family Medicine
